# Patient Record
Sex: Female | Born: 1960 | Race: White | Hispanic: No | Smoking: Current every day smoker
Health system: Southern US, Community
[De-identification: ages and names within clinical notes are randomized; demographics above are authoritative.]

## PROBLEM LIST (undated history)

## (undated) DIAGNOSIS — G8921 Chronic pain due to trauma: Secondary | ICD-10-CM

## (undated) DIAGNOSIS — N189 Chronic kidney disease, unspecified: Secondary | ICD-10-CM

## (undated) DIAGNOSIS — M1990 Unspecified osteoarthritis, unspecified site: Secondary | ICD-10-CM

## (undated) DIAGNOSIS — R519 Headache, unspecified: Secondary | ICD-10-CM

## (undated) DIAGNOSIS — I10 Essential (primary) hypertension: Secondary | ICD-10-CM

## (undated) DIAGNOSIS — R51 Headache: Secondary | ICD-10-CM

## (undated) HISTORY — DX: Unspecified osteoarthritis, unspecified site: M19.90

## (undated) HISTORY — DX: Chronic kidney disease, unspecified: N18.9

---

## 1992-01-02 HISTORY — PX: CHOLECYSTECTOMY: SHX55

## 1997-01-01 HISTORY — PX: FRACTURE SURGERY: SHX138

## 2003-10-07 ENCOUNTER — Ambulatory Visit: Payer: Self-pay

## 2004-06-26 ENCOUNTER — Inpatient Hospital Stay: Payer: Self-pay | Admitting: Internal Medicine

## 2004-09-22 ENCOUNTER — Ambulatory Visit: Payer: Self-pay

## 2005-01-21 ENCOUNTER — Emergency Department (HOSPITAL_COMMUNITY): Admission: EM | Admit: 2005-01-21 | Discharge: 2005-01-21 | Payer: Self-pay | Admitting: Emergency Medicine

## 2005-01-22 ENCOUNTER — Ambulatory Visit: Payer: Self-pay | Admitting: Orthopedic Surgery

## 2005-01-30 ENCOUNTER — Ambulatory Visit: Payer: Self-pay | Admitting: Orthopedic Surgery

## 2005-01-30 ENCOUNTER — Ambulatory Visit (HOSPITAL_COMMUNITY): Admission: RE | Admit: 2005-01-30 | Discharge: 2005-01-30 | Payer: Self-pay | Admitting: Orthopedic Surgery

## 2005-02-12 ENCOUNTER — Ambulatory Visit: Payer: Self-pay | Admitting: Orthopedic Surgery

## 2005-02-19 ENCOUNTER — Ambulatory Visit: Payer: Self-pay | Admitting: Orthopedic Surgery

## 2005-03-05 ENCOUNTER — Ambulatory Visit: Payer: Self-pay | Admitting: Orthopedic Surgery

## 2005-03-19 ENCOUNTER — Ambulatory Visit: Payer: Self-pay | Admitting: Orthopedic Surgery

## 2005-04-19 ENCOUNTER — Ambulatory Visit: Payer: Self-pay | Admitting: Orthopedic Surgery

## 2005-06-04 ENCOUNTER — Ambulatory Visit: Payer: Self-pay | Admitting: Orthopedic Surgery

## 2005-06-05 ENCOUNTER — Ambulatory Visit (HOSPITAL_COMMUNITY): Admission: RE | Admit: 2005-06-05 | Discharge: 2005-06-05 | Payer: Self-pay | Admitting: Orthopedic Surgery

## 2005-06-05 ENCOUNTER — Ambulatory Visit: Payer: Self-pay | Admitting: Orthopedic Surgery

## 2005-06-07 ENCOUNTER — Ambulatory Visit: Payer: Self-pay | Admitting: Orthopedic Surgery

## 2005-06-18 ENCOUNTER — Ambulatory Visit: Payer: Self-pay | Admitting: Orthopedic Surgery

## 2005-06-27 ENCOUNTER — Ambulatory Visit: Payer: Self-pay | Admitting: Orthopedic Surgery

## 2005-07-19 ENCOUNTER — Ambulatory Visit: Payer: Self-pay | Admitting: Orthopedic Surgery

## 2005-08-22 ENCOUNTER — Ambulatory Visit: Payer: Self-pay | Admitting: Orthopedic Surgery

## 2005-09-12 ENCOUNTER — Ambulatory Visit: Payer: Self-pay | Admitting: Orthopedic Surgery

## 2005-10-03 ENCOUNTER — Ambulatory Visit: Payer: Self-pay | Admitting: Orthopedic Surgery

## 2005-10-31 ENCOUNTER — Ambulatory Visit: Payer: Self-pay | Admitting: Orthopedic Surgery

## 2006-01-10 ENCOUNTER — Ambulatory Visit: Payer: Self-pay | Admitting: Orthopedic Surgery

## 2006-04-04 ENCOUNTER — Ambulatory Visit: Payer: Self-pay | Admitting: Orthopedic Surgery

## 2006-06-03 ENCOUNTER — Ambulatory Visit: Payer: Self-pay | Admitting: Orthopedic Surgery

## 2006-08-29 ENCOUNTER — Ambulatory Visit: Payer: Self-pay | Admitting: Orthopedic Surgery

## 2006-10-21 IMAGING — RF DG WRIST COMPLETE 3+V*L*
1 series · 5 of 5 positions shown · non-contrast
Comparison: Pre-reduction films of 01/22/05.

CLINICAL DATA: Displaced intraarticular fracture distal radius.  Ulnar styloid fracture.  Open reduction and internal fixation.
LEFT WRIST ? 5 VIEW:

[Series 1139: run · 5 of 5 slices shown]
[im 1/5]
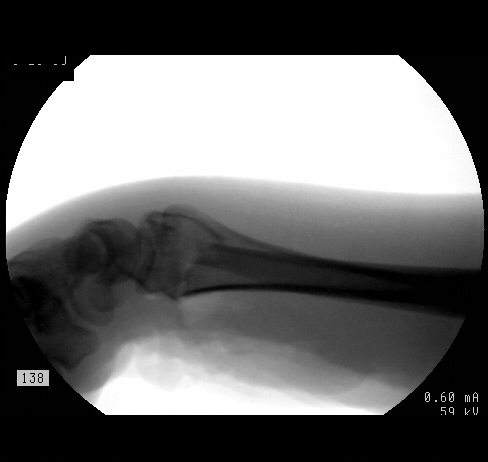
[im 2/5]
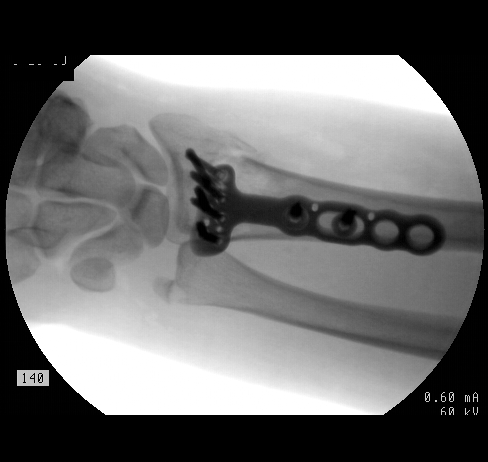
[im 3/5]
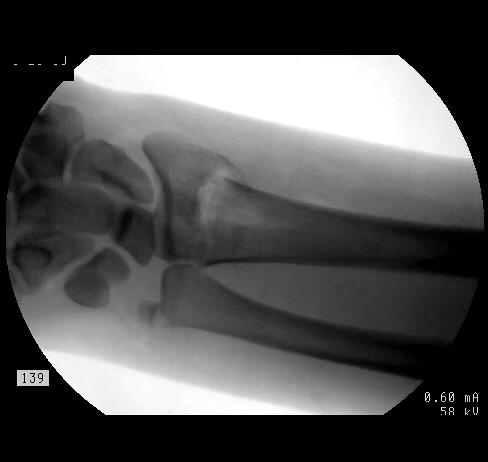
[im 4/5]
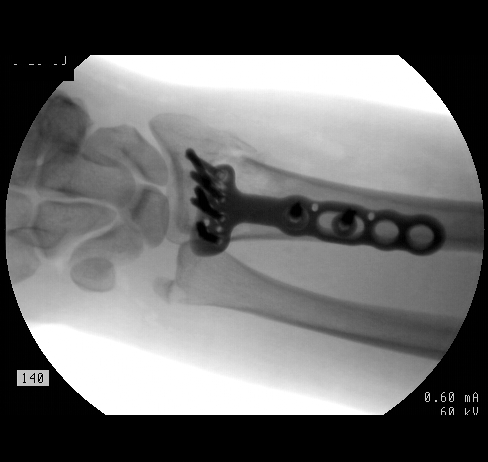
[im 5/5]
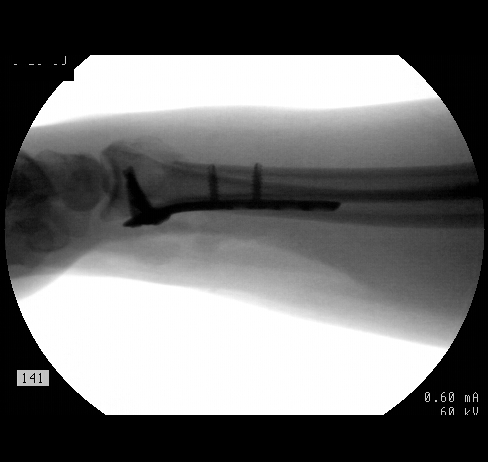

[5 of 5 positions shown; findings below may reference images not displayed]

FINDINGS: Five C-arm guided spot images were made.  Initial AP and lateral projections revealing satisfactory reduction of distal radial metaphyseal fracture and ulnar styloid process fracture.  Three views, two frontal and one lateral, revealing contoured plate and screw fixation of the distal radial metadiaphyseal region.  The plate lies along the dorsal aspect of the radius.  Fracture fragments have been reduced in satisfactory position and alignment.
IMPRESSION: Satisfactory ORIF right wrist.

## 2006-10-29 ENCOUNTER — Encounter: Payer: Self-pay | Admitting: Orthopedic Surgery

## 2006-11-25 ENCOUNTER — Encounter: Payer: Self-pay | Admitting: Orthopedic Surgery

## 2006-12-03 ENCOUNTER — Encounter: Payer: Self-pay | Admitting: Orthopedic Surgery

## 2006-12-13 ENCOUNTER — Encounter: Payer: Self-pay | Admitting: Orthopedic Surgery

## 2007-01-29 ENCOUNTER — Telehealth: Payer: Self-pay | Admitting: Orthopedic Surgery

## 2007-02-10 ENCOUNTER — Ambulatory Visit: Payer: Self-pay | Admitting: Orthopedic Surgery

## 2007-02-10 DIAGNOSIS — M25549 Pain in joints of unspecified hand: Secondary | ICD-10-CM

## 2007-05-12 ENCOUNTER — Ambulatory Visit: Payer: Self-pay | Admitting: Orthopedic Surgery

## 2007-09-03 ENCOUNTER — Ambulatory Visit: Payer: Self-pay | Admitting: Orthopedic Surgery

## 2007-11-24 ENCOUNTER — Encounter: Payer: Self-pay | Admitting: Orthopedic Surgery

## 2007-12-22 ENCOUNTER — Ambulatory Visit: Payer: Self-pay | Admitting: Orthopedic Surgery

## 2007-12-22 DIAGNOSIS — M19049 Primary osteoarthritis, unspecified hand: Secondary | ICD-10-CM | POA: Insufficient documentation

## 2008-02-23 ENCOUNTER — Ambulatory Visit: Payer: Self-pay | Admitting: Orthopedic Surgery

## 2008-03-22 ENCOUNTER — Encounter: Payer: Self-pay | Admitting: Orthopedic Surgery

## 2008-03-22 ENCOUNTER — Telehealth: Payer: Self-pay | Admitting: Orthopedic Surgery

## 2008-05-14 ENCOUNTER — Encounter (INDEPENDENT_AMBULATORY_CARE_PROVIDER_SITE_OTHER): Payer: Self-pay | Admitting: *Deleted

## 2008-05-20 ENCOUNTER — Ambulatory Visit: Payer: Self-pay | Admitting: Orthopedic Surgery

## 2008-05-21 ENCOUNTER — Encounter (INDEPENDENT_AMBULATORY_CARE_PROVIDER_SITE_OTHER): Payer: Self-pay | Admitting: *Deleted

## 2008-06-29 ENCOUNTER — Telehealth: Payer: Self-pay | Admitting: Orthopedic Surgery

## 2008-09-01 ENCOUNTER — Ambulatory Visit: Payer: Self-pay | Admitting: Orthopedic Surgery

## 2010-05-19 NOTE — H&P (Signed)
NAME:  Jessica Haley, Jessica Haley                 ACCOUNT NO.:  [id number]   MEDICAL RECORD NO.:  [id number]          PATIENT TYPE:  AMB   LOCATION:  DAY                           FACILITY:  [facility]   PHYSICIAN:  Vickki Hearing, M.D.DATE OF BIRTH:  [birth year]   DATE OF ADMISSION:  DATE OF DISCHARGE:  LH                                HISTORY & PHYSICAL   CHIEF COMPLAINT:  Numbness and tingling of the ring, long finger and thumb  of the right hand status post open treatment internal fixation distal radius  fracture with volar plating on 01/29/2005.  The patient is doing well terms  of motion in the wrist, strength is improving but she still has nocturnal  pain and tingling and numbness in the fingers as described.  She also has  hypersensitivity of distal portion of the scar and may have a median nerve  entrapment so we have recommended median nerve release   Review of systems report per the patient is negative.   ALLERGIES:  None.   MEDICAL PROBLEMS:  None.   PAST SURGERIES:  None.   MEDICATIONS:  Effexor 150 extended release once a day, Lorcet 10-06-48 1  q.4h. p.r.n. for pain #60.   FAMILY PHYSICIAN:  Dr. Graciela Husbands in [place].   She is single, does not smoke, no major drinking, caffeine use 2 cups per  day.   EXAM:  Weight is approximately 170, pulse 86, respiratory rate 18.  General  appearance normal.  Cardiovascular exam:  Normal pulse and perfusion.  Abdomen soft, no distention.  Neurologic exam: she is alert, awake and  oriented x3.  Mood was pleasant.  Sensation showed hypersensitivity distal  scar, decreased sensation of the long finger, ring finger and thumb.  Reflexes remain normal.  Lymphatic system is benign.  Examination of the  wrist shows a volar scar with tenderness over the distal portion of the scar  with hypersensitivity, decreased sensation in the fingers described.   Motion in the wrist now 45 degrees total with extension and flexion  combined.   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  Risks and benefits explained.  The patient understands she will  need some postoperative home exercises to work on strength and mobility.      Vickki Hearing, M.D.  Electronically Signed     SEH/MEDQ  D:  06/04/2005  T:  06/05/2005  Job:  [id number]

## 2010-05-19 NOTE — H&P (Signed)
NAME:  SHUNTEL, FISHBURN                 ACCOUNT NO.:  [id number]   MEDICAL RECORD NO.:  [id number]          PATIENT TYPE:  AMB   LOCATION:  DAY                           FACILITY:  [facility]   PHYSICIAN:  Vickki Hearing, M.D.DATE OF BIRTH:  1960/05/26   DATE OF ADMISSION:  01/29/2005  DATE OF DISCHARGE:  LH                                HISTORY & PHYSICAL   SHORT-STAY ADMISSION:   CHIEF COMPLAINT:  Pain, right wrist.   HISTORY:  This is a 50 year old female who fell January 21, 2005, sustained  distal radius fracture.  Had a closed reduction in the office to include  position.  She will have an open treatment and internal fixation with a  volar wrist plate.  She has consented for surgery.  Understands the risks  and benefits and option of no surgery with continued pain and deformity and  progressive and permanent loss of motion in the wrist without any procedure  done.   REVIEW OF SYSTEMS:  Reported by the patient as negative.   ALLERGIES:  She has no allergies.   She has no medical problems, no previous surgery.   Her only medication is Effexor 150 mg extended release once a day.   Her family physician is Dr. Alberteen Spindle in [place].  She is single, does not  smoke.  No major drinking, only on occasion socially.  Caffeine use: 2 cups  a day.   PHYSICAL EXAMINATION:  VITAL SIGNS:  Weight 170.  Pulse 86, respiratory rate  18.  GENERAL APPEARANCE:  Normal.  CARDIOVASCULAR: Normal.  ABDOMEN:  Soft, no distention.  NEUROLOGIC:  She was alert.  She was awake. She was oriented x3.  Her mood  was pleasant.  Sensation was normal.  Reflexes were intact.  LYMPHATIC SYSTEM: Normal.  EXTREMITIES: She had deformity of the right wrist, tenderness over the  distal radius.  She could move her fingers.  Her range of motion was limited  secondary to pain.   Radiographs show a dorsally displaced apex volarly angulated fracture of the  distal radius.   PLAN:  Open treatment and internal fixation  with a volar wrist plate to the  right wrist.      Vickki Hearing, M.D.  Electronically Signed     SEH/MEDQ  D:  01/29/2005  T:  01/29/2005  Job:  [id number]   cc:   [facility]  Fax: [phone]

## 2010-05-19 NOTE — Op Note (Signed)
NAME:  CHERRE, KOTHARI                 ACCOUNT NO.:  [id number]   MEDICAL RECORD NO.:  [id number]          PATIENT TYPE:  AMB   LOCATION:  DAY                           FACILITY:  [facility]   PHYSICIAN:  Vickki Hearing, M.D.DATE OF BIRTH:  [birth year]   DATE OF PROCEDURE:  06/05/2005  DATE OF DISCHARGE:                                 OPERATIVE REPORT   HISTORY:  Jessica Haley is a 50 year old female. She underwent open treatment  and internal fixation of a right distal radius fracture on January 29, 2005,  with volar plating. She did well but complained of nocturnal pain, numbness  and tingling of the long and ring finger and thumb. She was felt to have  carpal tunnel syndrome, post-traumatic, due scarring.   PREOPERATIVE DIAGNOSIS:  Right carpal tunnel syndrome.   POSTOPERATIVE DIAGNOSIS:  Right carpal tunnel syndrome.   PROCEDURE:  Open carpal tunnel release, lysis of adhesions, median nerve.   SURGEON:  Vickki Hearing, M.D.   ASSISTANT:  Forde Dandy.   ANESTHETIC:  Regional block.   SPECIMENS:  None.   OPERATIVE FINDINGS:  The distal extent of the carpal tunnel was essentially  normal.  The contents of the carpal tunnel were normal.  However, from the  flexion crease approximately 1/3 proximally into the forearm with the  previous incision was made, there was thickened, hypertrophic scar tissue  which communicated with the median nerve with adhesions.  These were  released.  The nerve was intact.   DESCRIPTION OF PROCEDURE:  The patient was seen in the preop holding area,  identified as Jessica Haley. Her right wrist was marked for surgery,  countersigned by the surgeon and the history and physical was updated.  She  was given a gram of Ancef.  She was taken to the operating room for regional  block.  After successful Bier block, the patient's right upper extremity was  prepped and draped using sterile technique from the fingers to the proximal  arm.  We took a time-out  completed as required.  Using the previous incision  and extending it into the palm in the mid palmar crease, the sub  subcutaneous tissue was then divided.  We started distal to proximal from  normal tissue to scar tissue, found the distal aspect of the transverse  carpal ligament, passed blunt instrumentation beneath the ligament to free  the nerve, divided the ligament up to the flexor retinaculum.  We teased the  scar tissue away from the skin area and the subcutaneous tissue.  We  immediately encountered hypertrophic scarring which was sharply dissected  from the underlying tissue.  We continued this dissection proximally and  found the scar tissue to be in continuity with the nerve. We released this  and then irrigated the wound.  We closed in layered fashion with 2-0  Monocryl and 3-0 nylon.  We did inject some local anesthetic, 10 mL, to  assist with postoperative anesthesia.  We placed the patient in a bulky  dressing.  She was taken to the recovery room in stable condition.   Follow-up will  be on Thursday for dressing change.  She is to keep the  dressing dry and clean. She is allowed to move the fingers.  She should keep  it elevated, apply ice packs as needed to control swelling.      Vickki Hearing, M.D.  Electronically Signed     SEH/MEDQ  D:  06/05/2005  T:  06/05/2005  Job:  [id number]

## 2010-05-19 NOTE — Op Note (Signed)
NAME:  Jessica Haley, Jessica Haley                 ACCOUNT NO.:  [id number]   MEDICAL RECORD NO.:  [id number]          PATIENT TYPE:  AMB   LOCATION:  DAY                           FACILITY:  [facility]   PHYSICIAN:  Vickki Hearing, M.D.DATE OF BIRTH:  Mar 01, 1960   DATE OF PROCEDURE:  01/30/2005  DATE OF DISCHARGE:                                 OPERATIVE REPORT   PREOPERATIVE DIAGNOSIS:  Fracture, right distal radius.   POSTOPERATIVE DIAGNOSIS:  Fracture, right distal radius.   PROCEDURE:  Open treatment internal fixation.   IMPLANTS USED:  DVR volar plate.   SURGEON:  Dr. Romeo Apple.   OPERATIVE FINDINGS:  Displaced distal radius fracture of the right upper  extremity.   INDICATIONS:  100% displacement and unstable fracture.   ANESTHETIC:  General.   The patient was identified in the preop holding area. Her right wrist was  marked surgery, countersigned by the surgeon. She was given antibiotics.  History and physical were updated. She was taken to the operating for  general anesthesia. Her right upper extremity was prepped and draped with  sterile technique. Time-out was taken as required. Closed reduction was  performed. I made the decision to go with a DVR plate versus an external  fixator based on the intraoperative tray.   After elevating the tourniquet, an incision was made over the flexor carpi  radialis tendon. Subcutaneous tissue was divided. Radial artery was  protected tendon. Sheath was incised. Tendon was moved ulnarly. These floor  of the FCR tendon was incised. The pronator quadratus was incised and moved  ulnarly as well. Fracture site was exposed. A ____________ was used to  manipulate the fracture into better position. Volar plate was applied with  one screw in the cortical bone, and the distal part of the plate was  adjusted to fit the distal fragment and also maintain fixation without going  into the joint. We then used a series of screws, locking and fully-threaded  and pegs to get fixation in the distal fragment. We placed one screw  ulnarly, then checked a radiograph, and I was happy with position. We then  filled all of the holes and then placed a second screw in the cortical bone  as well. We irrigated the wound, took final films including a 30 degree  lateral x-ray along with AP views. The fracture reduction was very good.  Wound was closed with 2-0 Monocryl and staples. We injected 1/2%  Sensorcaine. We put on the volar splint. We extubated  the patient and took her to the recovery room in stable condition. She is  discharged with 90 Tylox. She will follow up on Thursday for dressing  change, and at that time, we will also recommend that she get a volar  fabricated splint from occupational therapy and start flexion of the wrist  two weeks postoperatively.      Vickki Hearing, M.D.  Electronically Signed     SEH/MEDQ  D:  01/30/2005  T:  01/30/2005  Job:  [id number]

## 2012-02-29 ENCOUNTER — Ambulatory Visit: Payer: Self-pay | Admitting: Internal Medicine

## 2012-03-08 ENCOUNTER — Ambulatory Visit: Payer: Self-pay | Admitting: Gastroenterology

## 2012-03-10 ENCOUNTER — Ambulatory Visit: Payer: Self-pay | Admitting: Gastroenterology

## 2012-03-12 LAB — PATHOLOGY REPORT

## 2012-11-03 ENCOUNTER — Ambulatory Visit: Payer: Self-pay | Admitting: Pain Medicine

## 2015-01-10 ENCOUNTER — Ambulatory Visit: Payer: Self-pay | Admitting: Urology

## 2015-01-28 ENCOUNTER — Other Ambulatory Visit: Payer: Self-pay | Admitting: Internal Medicine

## 2015-01-28 DIAGNOSIS — R102 Pelvic and perineal pain: Secondary | ICD-10-CM

## 2015-01-28 DIAGNOSIS — R109 Unspecified abdominal pain: Principal | ICD-10-CM

## 2015-02-03 ENCOUNTER — Ambulatory Visit: Payer: Self-pay

## 2015-02-03 ENCOUNTER — Ambulatory Visit
Admission: RE | Admit: 2015-02-03 | Discharge: 2015-02-03 | Disposition: A | Discharge status: Home / Self Care | Payer: Managed Care, Other (non HMO) | Source: Ambulatory Visit | Attending: Internal Medicine | Admitting: Internal Medicine

## 2015-02-03 DIAGNOSIS — R102 Pelvic and perineal pain: Secondary | ICD-10-CM

## 2015-02-03 DIAGNOSIS — R1084 Generalized abdominal pain: Secondary | ICD-10-CM | POA: Diagnosis present

## 2015-02-03 DIAGNOSIS — R109 Unspecified abdominal pain: Secondary | ICD-10-CM

## 2015-02-03 MED ORDER — IOHEXOL 300 MG/ML  SOLN
100.0000 mL | Freq: Once | INTRAMUSCULAR | Status: AC | PRN
  Administered 2015-02-03: 100 mL via INTRAVENOUS

## 2015-03-24 ENCOUNTER — Encounter: Payer: Self-pay | Admitting: *Deleted

## 2015-04-07 ENCOUNTER — Ambulatory Visit: Payer: Self-pay | Admitting: General Surgery

## 2015-04-12 ENCOUNTER — Other Ambulatory Visit: Payer: Self-pay

## 2015-04-12 ENCOUNTER — Encounter: Payer: Self-pay | Admitting: General Surgery

## 2015-04-12 ENCOUNTER — Ambulatory Visit (INDEPENDENT_AMBULATORY_CARE_PROVIDER_SITE_OTHER): Payer: Managed Care, Other (non HMO) | Admitting: General Surgery

## 2015-04-12 DIAGNOSIS — E041 Nontoxic single thyroid nodule: Secondary | ICD-10-CM

## 2015-04-12 NOTE — Patient Instructions (Addendum)
Call office for any issues or concerns. Will follow up after scan.   Patient has been scheduled for a parathyroid scan at [facility] for Wednesday, 04-20-15 at 11 am (arrive 10:30 am). Prep: NPO after midnight.

## 2015-04-12 NOTE — Progress Notes (Signed)
Patient ID: Jessica Haley, female   DOB: 03/21/1960, 55 y.o.   MRN: [id number]  Chief Complaint  Patient presents with  . Other    hyperparathyroidism     HPI Jessica Haley is a 55 y.o. female here today for thyroid issues. She state she has been having issues for a year or two. She was told she high calcium in her blood. She has been really tired, forgetful. She has been seeing a pain management clinic in [place] for chronic pain associated with a prior right forearm wrist injury.  She does not get regular mammograms, she states her last one was a few years ago.  I have reviewed the history of present illness with the patient.  HPI  Past Medical History  Diagnosis Date  . Arthritis   . Chronic kidney disease     Stones    Past Surgical History  Procedure Laterality Date  . Cholecystectomy  1994  . Fracture surgery  1999    screws in arm    History reviewed. No pertinent family history.  Social History Social History  Substance Use Topics  . Smoking status: Current Every Day Smoker -- 5 years    Types: Cigarettes  . Smokeless tobacco: None  . Alcohol Use: 0.6 - 1.2 oz/week    1-2 Standard drinks or equivalent per week    No Active Allergies  Current Outpatient Prescriptions  Medication Sig Dispense Refill  . meloxicam (MOBIC) 15 MG tablet Take 15 mg by mouth daily.    Marland Kitchen oxyCODONE (OXYCONTIN) 20 mg 12 hr tablet     . venlafaxine XR (EFFEXOR-XR) 75 MG 24 hr capsule     . Oxycodone HCl 10 MG TABS Take by mouth.     No current facility-administered medications for this visit.    Review of Systems Review of Systems  Constitutional: Positive for fatigue.  Respiratory: Negative.   Cardiovascular: Negative.     Blood pressure 138/72, pulse 88, resp. rate 12, height 5\' 7"  (1.702 m), weight 213 lb (96.616 kg).  Physical Exam Physical Exam  Constitutional: She is oriented to person, place, and time. She appears well-developed and well-nourished.  Eyes: Conjunctivae  are normal. No scleral icterus.  Neck: Neck supple. No thyromegaly present.  Cardiovascular: Normal rate, regular rhythm and normal heart sounds.   Pulmonary/Chest: Effort normal and breath sounds normal.  Abdominal: Soft. She exhibits no mass. There is no hepatomegaly. There is no tenderness.  Lymphadenopathy:    She has no cervical adenopathy.  Neurological: She is alert and oriented to person, place, and time.  Skin: Skin is warm and dry.    Data Reviewed Notes and Labs reviewed.  Neck US as performed. There is a dominant left thyroid nodule with mixed echogenicity, a cyst in right lobe. There ae other 2-50mm nodules on both sides. No obvious suggestion of a parathyroid mass Assessment    Hypercalcemia. Elevated PTH 2 mos ago. Ca levels around 11.    Thyroid nodule.   Plan   Discussed findings fully with pt.  Arrange to repeat CA, PTH, T4,TSH. Parathyroid scan.      Patient has been scheduled for a parathyroid scan at [facility] for Wednesday, 04-20-15 at 11 am (arrive 10:30 am). Prep: NPO after midnight.   PCP: Dr. Caryl Comes This information has been scribed by Verlene Mayer, CMA      [facility] 04/13/2015, 11:17 AM

## 2015-04-13 ENCOUNTER — Encounter: Payer: Self-pay | Admitting: General Surgery

## 2015-04-20 ENCOUNTER — Encounter
Admission: RE | Admit: 2015-04-20 | Discharge: 2015-04-20 | Disposition: A | Discharge status: Home / Self Care | Payer: Managed Care, Other (non HMO) | Source: Ambulatory Visit | Attending: General Surgery | Admitting: General Surgery

## 2015-04-20 DIAGNOSIS — E041 Nontoxic single thyroid nodule: Secondary | ICD-10-CM | POA: Insufficient documentation

## 2015-04-20 MED ORDER — TECHNETIUM TC 99M SESTAMIBI - CARDIOLITE
24.8500 | Freq: Once | INTRAVENOUS | Status: AC | PRN
  Administered 2015-04-20: 12:00:00 24.85 via INTRAVENOUS

## 2015-04-25 ENCOUNTER — Telehealth: Payer: Self-pay | Admitting: *Deleted

## 2015-04-25 NOTE — Telephone Encounter (Signed)
Second message left for patient to have labs T4, TSH & PTH drawn before appointment 04/26/15.

## 2015-04-26 ENCOUNTER — Encounter: Payer: Self-pay | Admitting: General Surgery

## 2015-04-26 ENCOUNTER — Ambulatory Visit (INDEPENDENT_AMBULATORY_CARE_PROVIDER_SITE_OTHER): Payer: Managed Care, Other (non HMO) | Admitting: General Surgery

## 2015-04-26 DIAGNOSIS — E041 Nontoxic single thyroid nodule: Secondary | ICD-10-CM | POA: Diagnosis not present

## 2015-04-26 NOTE — Patient Instructions (Signed)
Patient is scheduled for surgery at [facility] on 05/09/15. She will pre admit by phone. Patient is aware of date and instructions.

## 2015-04-26 NOTE — Progress Notes (Signed)
This is a 55 year old female here today for her follow up parathyroid scan done on 04/20/15. I have reviewed the history of present illness with the patient.   Scan was reviewed showing persistent uptake in the right posterior thyroid region\ With suspicion of a 51mm nodule behind the right lower lobe of the thyroid, consitent with parathyroid adenoma.  No apparent thyroid nodules were noted on this study. Patient had repeat PTH and Ca levels drawn today.  Discussed excision of parathyroid adenoma and patient is agreeable Procedure risks and benefits were explained   Patient is scheduled for surgery at [facility] on 05/09/15. She will pre admit by phone. Patient is aware of date and instructions.   PCP:  [facility]  This information has been scribed by Gaspar Cola CMA.

## 2015-04-27 ENCOUNTER — Ambulatory Visit: Payer: Self-pay | Admitting: Urology

## 2015-04-27 LAB — PTH, INTACT AND CALCIUM
CALCIUM: 10.9 mg/dL — AB (ref 8.7–10.2)
PTH: 79 pg/mL — AB (ref 15–65)

## 2015-04-27 LAB — T4 AND TSH
T4, Total: 7.4 ug/dL (ref 4.5–12.0)
TSH: 2.18 u[IU]/mL (ref 0.450–4.500)

## 2015-04-29 MED ORDER — CEFAZOLIN SODIUM-DEXTROSE 2-4 GM/100ML-% IV SOLN
INTRAVENOUS | Status: AC
  Filled 2015-04-29: qty 100

## 2015-04-29 MED ORDER — FAMOTIDINE 20 MG PO TABS
ORAL_TABLET | ORAL | Status: AC
  Filled 2015-04-29: qty 1

## 2015-05-02 ENCOUNTER — Other Ambulatory Visit: Payer: Managed Care, Other (non HMO)

## 2015-05-05 ENCOUNTER — Encounter: Payer: Self-pay | Admitting: *Deleted

## 2015-05-05 NOTE — Pre-Procedure Instructions (Signed)
Cheryll Cockayne, MD - 03/23/2015 4:00 PM EDT Formatting of this note may be different from the original. Jessica Haley is evaluated for hypertension, hyperglycemia, hyperlipidemia, chronic arm pain, depression, hyperparathyroidism. She continues to work with pain management for her chronic arm pain; this is reasonably controlled. She did not yet do her 24-hour urine collection for calcium, but her last serum calcium was improved. Continues to have a lot of fatigue; she wonders whether the calcium plays a role, but it did not really seem to change when her calcium level improved. She is not sleeping well secondary to frequent nocturia. She is better when she is up and moving. She notices that it does get worse when she is lying on the couch. Blood recent urinalysis and culture were negative. Has not been back over to Dr. Laurey Morale. Depression/anxiety appears to be better. Blood pressure was better on recheck and remains controlled when she checks it elsewhere.  ROS Please see HPI; remainder of complete 10 point ROS is negative  Current Outpatient Prescriptions  Medication Sig Dispense Refill  . MELOXICAM (MOBIC ORAL) Take by mouth. One po in am  . oxyCODONE (ROXICODONE) 10 mg tablet Take 10 mg by mouth 3 (three) times daily as needed for Pain.  . OXYCONTIN 20 mg CR tablet TK 1 T PO BID 0  . venlafaxine (EFFEXOR-XR) 75 MG XR capsule Take 3 capsules (225 mg total) by mouth once daily. 90 capsule 11   No current facility-administered medications for this visit.   Allergies as of 03/23/2015 Elta Gu[place] as Reviewed 03/23/2015  Allergen Reaction Noted  . Codeine sulfate Nausea 09/14/2013  . Lisinopril Other (See Comments) 09/14/2013   Past Medical History  Diagnosis Date  . Depression, unspecified  . Hyperglycemia  . Hyperlipidemia, unspecified  . Hyperparathyroidism (CMS-HCC) 01/23/2015  . Hypertension  . Migraine headache   Past Surgical History  Procedure Laterality Date  . Cholecystectomy   . Status post fall with right wrist fracture january 2007 requiring surgical plating, dr. Arther Abbott  . Probable carpal tunnel surgery, per patient description, 2007   Results for orders placed or performed in visit on 02/02/15  EXTERNAL RADIOLOGY RESULT -CT  Narrative  Ordered by an unspecified provider.   Visit Vitals  . BP 138/86  . Pulse (P) 81  . Ht (P) 167.6 cm ('5\' 6"' )  . Wt (P) 96.2 kg (212 lb)  . SpO2 (P) 96%  . BMI (P) 34.22 kg/m2   GENERAL: Well developed, well nourished female, in no acute distress. HEENT: Pupils equal, round and reactive to light. Oropharynx is moist without lesions. Extraocular motion intact.  NECK: Supple, trachea midline. No thyromegaly.  CHEST: Chest wall is within normal limits.  LUNGS: Clear bilaterally without retractions or wheezing.  CARDIAC: Regular rate and rhythm without murmurs, rubs or gallops.  VASCULAR: Carotid and radial pulses are 2+. Distal pulses 2+.  ABDOMEN: Soft, nontender, positive bowel sounds. No guard, rebound, mass.  EXTREMITIES: No clubbing, cyanosis or edema. Postoperative changes in the right wrist; muscular atrophy.  NEUROLOGIC: Cranial nerves intact with gait normal. Decreased grip in the right hand.  DERMATOLOGIC: Multiple tattoos; no significant rashes or nodules. LYMPH: No cervical or supraclavicular lymphadenopathy   Essential hypertension (primary encounter diagnosis) Post-traumatic osteoarthritis of right hand Other hyperlipidemia Hyperglycemia Recurrent major depressive disorder, in full remission (CMS-HCC) Hyperparathyroidism (CMS-HCC) Nocturnal polyuria  Assessment and Plan  1. Hypertension. Better on recheck; she needs to follow this. Limit salt. Follow mets C, lipids, TSH,  urinalysis.  2. Tobacco abuse. Have encouraged complete avoidance of tobacco; she is not interested currently. She will let us know if we can assist 3. Hyperglycemia. Continue to work on weight reduction. Monitor met B, A1c,  urinalysis periodically. Treatment guidelines have been reviewed  4. Chronic arm pain. Continue to follow up with the pain clinic. Pain medications are through that office 5. Hyperlipidemia. Again encouraged lifestyle efforts. Follow lipids periodically. 6. Depression/anxiety. Better on current regimen. Contact us for progression 7. Hypercalcemia/hyperparathyroidism. Discussed findings. CT of the abdomen/pelvis did not show any stones. 24-hour urine collection for calcium was again ordered. Recheck metabolic panel today 8. Dysuria/polyuria. CT unremarkable. Recent urine culture negative. Urology consultation, as I am concerned her insomnia related to this is a significant portion of her fatigue 9. health Maintenance. She has declined colonoscopy and Hemoccults previously. She declines all immunizations and mammogram and is aware of risk. Follow-up in 3 months, sooner if needed  [place], MD  Portions of this note were created using dictation software and may contain typographical errors.   Plan of Treatment - as of this encounter Upcoming Encounters Upcoming Encounters  Date Type Specialty Care Team Description  06/16/2015 Ancillary Orders Lab Cheryll Cockayne, MD  [place]  [facility] [place], [place] [address]  [phone]  [phone] ([phone]    06/23/2015 Office Visit Internal Medicine Cheryll Cockayne, MD  [place]  [facility] [place], [place] [address]  [phone]  [phone] (Fax)     Scheduled Tests Scheduled Tests  Name Priority Associated Diagnoses Order Schedule  Calcium, 24Hr Urine - [facility] Routine Hyperparathyroidism (CMS-HCC)  Ordered: 03/23/2015  CBC w/auto Differential (5 Part) Routine Post-traumatic osteoarthritis of right hand  Expected: 06/01/2015 (Approximate), Expires: 03/23/2016  Comprehensive Metabolic Panel (CMP) Routine Essential hypertension  Expected: 06/14/2015 (Approximate),  Expires: 03/23/2016  Hemoglobin A1C Routine Hyperglycemia  Expected: 06/01/2015 (Approximate), Expires: 03/23/2016  Lipid Panel w/calc LDL Routine Other hyperlipidemia  Expected: 06/14/2015 (Approximate), Expires: 03/23/2016  Urinalysis w/Microscopic Routine Hyperglycemia  Expected: 06/01/2015 (Approximate), Expires: 03/23/2016   Scheduled Referrals Scheduled Referrals  Name Priority Associated Diagnoses Order Schedule  Ambulatory Referral to Urology Routine Nocturnal polyuria  Ordered: 03/23/2015  Lab Results - in this encounter   Basic Metabolic Panel (BMP) (63/14/9702 4:35 PM) Basic Metabolic Panel (BMP) (63/78/5885 4:35 PM)  Component Value Ref Range  Glucose 79 70 - 110 mg/dL  Sodium 141 136 - 145 mmol/L  Potassium 4.3 3.6 - 5.1 mmol/L  Chloride 108 97 - 109 mmol/L  Carbon Dioxide (CO2) 29.0 22.0 - 32.0 mmol/L  Calcium 11.3 (H) 8.7 - 10.3 mg/dL  Urea Nitrogen (BUN) 21 7 - 25 mg/dL  Creatinine 0.8 0.6 - 1.1 mg/dL  Glomerular Filtration Rate (eGFR), MDRD Estimate 74 >60 mL/min/1.73sq m  BUN/Crea Ratio 26.3 (H) 6.0 - 20.0  Anion Gap w/K 8.3 6.0 - 02.7   Basic Metabolic Panel (BMP) (74/12/8784 4:35 PM)  Specimen Performing Laboratory  [facility]  [place], [place] [address]   Visit Diagnoses - in this encounter Diagnosis  Essential hypertension - Primary  Post-traumatic osteoarthritis of right hand  Other hyperlipidemia  Hyperglycemia, unspecified  Other abnormal glucose   Recurrent major depressive disorder, in full remission (CMS-HCC)  Hyperparathyroidism (CMS-HCC)  Nocturnal polyuria  Document Information Service Providers Document Coverage Dates Mar. 22, 2017 - Mar. 22, 2017 [place] [phone] (Work) [place],  [address]  Encounter Providers Cheryll Cockayne MD (Attending) [phone] (Work) [phone] (Fax)  [facility]  [place], [place] [address]

## 2015-05-05 NOTE — Patient Instructions (Signed)
  Your procedure is scheduled on: 05-09-15 Report to [facility] To find out your arrival time please call [phone] between 1PM - 3PM on 05-06-15  Remember: Instructions that are not followed completely may result in serious medical risk, up to and including death, or upon the discretion of your surgeon and anesthesiologist your surgery may need to be rescheduled.    _X___ 1. Do not eat food or drink liquids after midnight. No gum chewing or hard candies.     _X___ 2. No Alcohol for 24 hours before or after surgery.   ____ 3. Bring all medications with you on the day of surgery if instructed.    ____ 4. Notify your doctor if there is any change in your medical condition     (cold, fever, infections).     Do not wear jewelry, make-up, hairpins, clips or nail polish.  Do not wear lotions, powders, or perfumes. You may wear deodorant.  Do not shave 48 hours prior to surgery. Men may shave face and neck.  Do not bring valuables to the hospital.    [facility] is not responsible for any belongings or valuables.               Contacts, dentures or bridgework may not be worn into surgery.  Leave your suitcase in the car. After surgery it may be brought to your room.  For patients admitted to the hospital, discharge time is determined by your treatment team.   Patients discharged the day of surgery will not be allowed to drive home.   Please read over the following fact sheets that you were given:      __X__ Take these medicines the morning of surgery with A SIP OF WATER:    1. OXYCONTIN  2. EFFEXOR  3.   4.  5.  6.  ____ Fleet Enema (as directed)   ____ Use CHG Soap as directed  ____ Use inhalers on the day of surgery  ____ Stop metformin 2 days prior to surgery    ____ Take 1/2 of usual insulin dose the night before surgery and none on the morning of surgery.   ____ Stop Coumadin/Plavix/aspirin-N/A  _X___ Stop Anti-inflammatories-STOP  MELOXICAM NOW-NO NSAIDS OR ASA PRODUCTS-OXYCODONE/TYLENOL OK TO TAKE   ____ Stop supplements until after surgery.    ____ Bring C-Pap to the hospital.

## 2015-05-06 ENCOUNTER — Encounter: Payer: Self-pay | Admitting: *Deleted

## 2015-05-09 ENCOUNTER — Ambulatory Visit
Admission: RE | Admit: 2015-05-09 | Discharge: 2015-05-09 | Disposition: A | Discharge status: Home / Self Care | Payer: Managed Care, Other (non HMO) | Source: Ambulatory Visit | Attending: General Surgery | Admitting: General Surgery

## 2015-05-09 ENCOUNTER — Encounter: Payer: Self-pay | Admitting: *Deleted

## 2015-05-09 ENCOUNTER — Ambulatory Visit: Payer: Managed Care, Other (non HMO) | Admitting: Anesthesiology

## 2015-05-09 ENCOUNTER — Encounter
Admission: RE | Disposition: A | Discharge status: Home / Self Care | Payer: Self-pay | Source: Ambulatory Visit | Attending: General Surgery

## 2015-05-09 DIAGNOSIS — M1990 Unspecified osteoarthritis, unspecified site: Secondary | ICD-10-CM | POA: Insufficient documentation

## 2015-05-09 DIAGNOSIS — D351 Benign neoplasm of parathyroid gland: Secondary | ICD-10-CM | POA: Insufficient documentation

## 2015-05-09 DIAGNOSIS — Z87442 Personal history of urinary calculi: Secondary | ICD-10-CM | POA: Insufficient documentation

## 2015-05-09 DIAGNOSIS — I10 Essential (primary) hypertension: Secondary | ICD-10-CM | POA: Insufficient documentation

## 2015-05-09 DIAGNOSIS — E213 Hyperparathyroidism, unspecified: Secondary | ICD-10-CM | POA: Diagnosis not present

## 2015-05-09 DIAGNOSIS — R51 Headache: Secondary | ICD-10-CM | POA: Diagnosis not present

## 2015-05-09 DIAGNOSIS — E669 Obesity, unspecified: Secondary | ICD-10-CM | POA: Diagnosis not present

## 2015-05-09 DIAGNOSIS — Z6832 Body mass index (BMI) 32.0-32.9, adult: Secondary | ICD-10-CM | POA: Diagnosis not present

## 2015-05-09 DIAGNOSIS — F17200 Nicotine dependence, unspecified, uncomplicated: Secondary | ICD-10-CM | POA: Diagnosis not present

## 2015-05-09 HISTORY — PX: PARATHYROIDECTOMY: SHX19

## 2015-05-09 HISTORY — DX: Headache: R51

## 2015-05-09 HISTORY — DX: Essential (primary) hypertension: I10

## 2015-05-09 HISTORY — DX: Chronic pain due to trauma: G89.21

## 2015-05-09 HISTORY — DX: Headache, unspecified: R51.9

## 2015-05-09 LAB — CALCIUM: CALCIUM: 10.3 mg/dL (ref 8.9–10.3)

## 2015-05-09 SURGERY — PARATHYROIDECTOMY
Anesthesia: General | Wound class: Clean

## 2015-05-09 MED ORDER — BUPIVACAINE HCL (PF) 0.5 % IJ SOLN
INTRAMUSCULAR | Status: DC | PRN
  Administered 2015-05-09: 13 mL

## 2015-05-09 MED ORDER — DEXAMETHASONE SODIUM PHOSPHATE 10 MG/ML IJ SOLN
INTRAMUSCULAR | Status: DC | PRN
  Administered 2015-05-09: 5 mg via INTRAVENOUS

## 2015-05-09 MED ORDER — ROCURONIUM BROMIDE 100 MG/10ML IV SOLN
INTRAVENOUS | Status: DC | PRN
  Administered 2015-05-09: 40 mg via INTRAVENOUS

## 2015-05-09 MED ORDER — ACETAMINOPHEN 10 MG/ML IV SOLN
INTRAVENOUS | Status: AC
  Filled 2015-05-09: qty 100

## 2015-05-09 MED ORDER — ONDANSETRON HCL 4 MG/2ML IJ SOLN: 4.0000 mg | Freq: Once | INTRAMUSCULAR | Status: DC | PRN

## 2015-05-09 MED ORDER — FENTANYL CITRATE (PF) 100 MCG/2ML IJ SOLN
INTRAMUSCULAR | Status: AC
  Administered 2015-05-09: 25 ug via INTRAVENOUS
  Filled 2015-05-09: qty 2

## 2015-05-09 MED ORDER — LIDOCAINE HCL (CARDIAC) 20 MG/ML IV SOLN
INTRAVENOUS | Status: DC | PRN
  Administered 2015-05-09: 100 mg via INTRAVENOUS

## 2015-05-09 MED ORDER — PROPOFOL 10 MG/ML IV BOLUS
INTRAVENOUS | Status: DC | PRN
  Administered 2015-05-09: 30 mg via INTRAVENOUS
  Administered 2015-05-09: 150 mg via INTRAVENOUS

## 2015-05-09 MED ORDER — BUPIVACAINE HCL (PF) 0.5 % IJ SOLN
INTRAMUSCULAR | Status: AC
  Filled 2015-05-09: qty 30

## 2015-05-09 MED ORDER — LACTATED RINGERS IV SOLN
INTRAVENOUS | Status: DC
  Administered 2015-05-09 (×2): via INTRAVENOUS

## 2015-05-09 MED ORDER — FAMOTIDINE 20 MG PO TABS
20.0000 mg | ORAL_TABLET | Freq: Once | ORAL | Status: AC
Start: 2015-05-09 — End: 2015-05-09
  Administered 2015-05-09: 20 mg via ORAL

## 2015-05-09 MED ORDER — EPHEDRINE SULFATE 50 MG/ML IJ SOLN
INTRAMUSCULAR | Status: DC | PRN
  Administered 2015-05-09: 10 mg via INTRAVENOUS

## 2015-05-09 MED ORDER — TRAMADOL HCL 50 MG PO TABS
ORAL_TABLET | ORAL | Status: AC
  Filled 2015-05-09: qty 1

## 2015-05-09 MED ORDER — SUGAMMADEX SODIUM 200 MG/2ML IV SOLN
INTRAVENOUS | Status: DC | PRN
Start: 2015-05-09 — End: 2015-05-09
  Administered 2015-05-09: 190 mg via INTRAVENOUS

## 2015-05-09 MED ORDER — FENTANYL CITRATE (PF) 100 MCG/2ML IJ SOLN
25.0000 ug | INTRAMUSCULAR | Status: DC | PRN
  Administered 2015-05-09 (×4): 25 ug via INTRAVENOUS

## 2015-05-09 MED ORDER — FAMOTIDINE 20 MG PO TABS
ORAL_TABLET | ORAL | Status: AC
  Filled 2015-05-09: qty 1

## 2015-05-09 MED ORDER — CHLORHEXIDINE GLUCONATE 4 % EX LIQD: 1.0000 "application " | Freq: Once | CUTANEOUS | Status: DC

## 2015-05-09 MED ORDER — FENTANYL CITRATE (PF) 100 MCG/2ML IJ SOLN
INTRAMUSCULAR | Status: DC | PRN
  Administered 2015-05-09 (×2): 50 ug via INTRAVENOUS

## 2015-05-09 MED ORDER — ONDANSETRON HCL 4 MG/2ML IJ SOLN
INTRAMUSCULAR | Status: DC | PRN
  Administered 2015-05-09: 4 mg via INTRAVENOUS

## 2015-05-09 MED ORDER — TRAMADOL HCL 50 MG PO TABS
50.0000 mg | ORAL_TABLET | Freq: Four times a day (QID) | ORAL | Status: DC | PRN
  Administered 2015-05-09: 50 mg via ORAL
  Filled 2015-05-09 (×2): qty 1

## 2015-05-09 MED ORDER — MIDAZOLAM HCL 2 MG/2ML IJ SOLN
INTRAMUSCULAR | Status: DC | PRN
  Administered 2015-05-09: 2 mg via INTRAVENOUS

## 2015-05-09 MED ORDER — ACETAMINOPHEN 10 MG/ML IV SOLN
INTRAVENOUS | Status: DC | PRN
  Administered 2015-05-09: 1000 mg via INTRAVENOUS

## 2015-05-09 SURGICAL SUPPLY — 57 items
CANISTER SUCT 1200ML W/VALVE (MISCELLANEOUS) ×3 IMPLANT
CHLORAPREP W/TINT 26ML (MISCELLANEOUS) ×3 IMPLANT
CLOSURE WOUND 1/2 X4 (GAUZE/BANDAGES/DRESSINGS)
CNTNR SPEC 2.5X3XGRAD LEK (MISCELLANEOUS) ×2
CONT SPEC 4OZ STER OR WHT (MISCELLANEOUS) ×4
CONT SPEC 4OZ STRL OR WHT (MISCELLANEOUS) ×2
CONTAINER SPEC 2.5X3XGRAD LEK (MISCELLANEOUS) ×1 IMPLANT
COVER PROBE FLX POLY STRL (MISCELLANEOUS) IMPLANT
DRAIN TLS ROUND 10FR (DRAIN) IMPLANT
DRAPE LAPAROTOMY TRNSV 106X77 (MISCELLANEOUS) ×3 IMPLANT
DRAPE MAG INST 16X20 L/F (DRAPES) ×3 IMPLANT
DRAPE SHEET LG 3/4 BI-LAMINATE (DRAPES) IMPLANT
DRSG TELFA 3X8 NADH (GAUZE/BANDAGES/DRESSINGS) IMPLANT
ELECT REM PT RETURN 9FT ADLT (ELECTROSURGICAL) ×3
ELECTRODE REM PT RTRN 9FT ADLT (ELECTROSURGICAL) ×1 IMPLANT
GAUZE SPONGE 4X4 12PLY STRL (GAUZE/BANDAGES/DRESSINGS) IMPLANT
GLOVE BIO SURGEON STRL SZ7 (GLOVE) ×17 IMPLANT
GLOVE BIO SURGEON STRL SZ7.5 (GLOVE) ×1 IMPLANT
GLOVE INDICATOR 8.0 STRL GRN (GLOVE) ×1 IMPLANT
GOWN STRL REUS W/ TWL LRG LVL3 (GOWN DISPOSABLE) ×2 IMPLANT
GOWN STRL REUS W/TWL LRG LVL3 (GOWN DISPOSABLE) ×12
KIT RM TURNOVER STRD PROC AR (KITS) ×3 IMPLANT
LABEL OR SOLS (LABEL) IMPLANT
LIQUID BAND (GAUZE/BANDAGES/DRESSINGS) ×3 IMPLANT
NDL HYPO 25X1 1.5 SAFETY (NEEDLE) IMPLANT
NEEDLE HYPO 25X1 1.5 SAFETY (NEEDLE) ×3 IMPLANT
NS IRRIG 500ML POUR BTL (IV SOLUTION) ×3 IMPLANT
PACK BASIN MINOR ARMC (MISCELLANEOUS) ×3 IMPLANT
PAD DRESSING TELFA 3X8 NADH (GAUZE/BANDAGES/DRESSINGS) IMPLANT
SET INFUS ANGEL WING 19GX3/4 8 (SET/KITS/TRAYS/PACK) IMPLANT
SHEARS HARMONIC 9CM CVD (BLADE) ×2 IMPLANT
SLEVE PROBE SENORX GAMMA FIND (MISCELLANEOUS) IMPLANT
SPONGE KITTNER 5P (MISCELLANEOUS) ×3 IMPLANT
STRIP CLOSURE SKIN 1/2X4 (GAUZE/BANDAGES/DRESSINGS) IMPLANT
SUCTION FRAZIER HANDLE 10FR (MISCELLANEOUS)
SUCTION TUBE FRAZIER 10FR DISP (MISCELLANEOUS) ×1 IMPLANT
SUT ETHILON 3-0 FS-10 30 BLK (SUTURE) ×3
SUT MNCRL 4-0 (SUTURE) ×3
SUT MNCRL 4-0 27XMFL (SUTURE) ×1
SUT SILK 2 0 (SUTURE) ×3
SUT SILK 2-0 18XBRD TIE 12 (SUTURE) ×1 IMPLANT
SUT SILK 3 0 (SUTURE)
SUT SILK 3-0 (SUTURE) ×1 IMPLANT
SUT SILK 3-0 18XBRD TIE 12 (SUTURE) ×1 IMPLANT
SUT SILK 4 0 (SUTURE)
SUT SILK 4-0 18XBRD TIE 12 (SUTURE) ×1 IMPLANT
SUT VIC AB 3-0 SH 27 (SUTURE) ×6
SUT VIC AB 3-0 SH 27X BRD (SUTURE) ×2 IMPLANT
SUT VIC AB 4-0 FS2 27 (SUTURE) ×3 IMPLANT
SUT VICRYL+ 3-0 144IN (SUTURE) ×3 IMPLANT
SUTURE EHLN 3-0 FS-10 30 BLK (SUTURE) ×1 IMPLANT
SUTURE MNCRL 4-0 27XMF (SUTURE) IMPLANT
SWABSTK COMLB BENZOIN TINCTURE (MISCELLANEOUS) IMPLANT
SYR BULB IRRIG 60ML STRL (SYRINGE) ×3 IMPLANT
SYR CONTROL 10ML (SYRINGE) ×2 IMPLANT
SYSTEM CHEST DRAIN TLS 7FR (DRAIN) IMPLANT
TAPE TRANSPORE STRL 2 31045 (GAUZE/BANDAGES/DRESSINGS) ×2 IMPLANT

## 2015-05-09 NOTE — Transfer of Care (Signed)
Immediate Anesthesia Transfer of Care Note  Patient: Jessica Haley  Procedure(s) Performed: Procedure(s): PARATHYROIDECTOMY (N/A)  Patient Location: PACU  Anesthesia Type:General  Level of Consciousness: awake, alert  and oriented  Airway & Oxygen Therapy: Patient Spontanous Breathing and Patient connected to face mask oxygen  Post-op Assessment: Report given to RN and Post -op Vital signs reviewed and stable  Post vital signs: Reviewed and stable  Last Vitals:  Filed Vitals:   05/09/15 0623 05/09/15 0656  BP: 150/102 143/91  Pulse: 84   Temp: 36.4 C     Last Pain:  Filed Vitals:   05/09/15 0658  PainSc: 5          Complications: No apparent anesthesia complications

## 2015-05-09 NOTE — Anesthesia Procedure Notes (Signed)
Procedure Name: Intubation Date/Time: 05/09/2015 7:30 AM Performed by: [facility] Pre-anesthesia Checklist: Patient identified, Emergency Drugs available, Suction available and Patient being monitored Patient Re-evaluated:Patient Re-evaluated prior to inductionOxygen Delivery Method: Circle system utilized Preoxygenation: Pre-oxygenation with 100% oxygen Intubation Type: IV induction Ventilation: Mask ventilation without difficulty Laryngoscope Size: Mac and 3 Grade View: Grade I Tube type: Oral Tube size: 7.0 mm Number of attempts: 1 Airway Equipment and Method: Patient positioned with wedge pillow and Stylet Placement Confirmation: ETT inserted through vocal cords under direct vision,  positive ETCO2 and breath sounds checked- equal and bilateral Secured at: 21 cm Tube secured with: Tape Dental Injury: Teeth and Oropharynx as per pre-operative assessment  Difficulty Due To: Difficulty was unanticipated

## 2015-05-09 NOTE — Anesthesia Postprocedure Evaluation (Signed)
Anesthesia Post Note  Patient: Jessica Haley  Procedure(s) Performed: Procedure(s) (LRB): PARATHYROIDECTOMY (N/A)  Patient location during evaluation: PACU Anesthesia Type: General Level of consciousness: awake and alert Pain management: pain level controlled Vital Signs Assessment: post-procedure vital signs reviewed and stable Respiratory status: spontaneous breathing, nonlabored ventilation, respiratory function stable and patient connected to nasal cannula oxygen Cardiovascular status: blood pressure returned to baseline and stable Postop Assessment: no signs of nausea or vomiting Anesthetic complications: no    Last Vitals:  Filed Vitals:   05/09/15 1042 05/09/15 1114  BP: 138/74 124/66  Pulse: 80 83  Temp:    Resp: 14 14    Last Pain:  Filed Vitals:   05/09/15 1116  PainSc: 3                  Alynn Ellithorpe S

## 2015-05-09 NOTE — H&P (View-Only) (Signed)
Patient ID: Jessica Haley, female   DOB: 03/21/1960, 55 y.o.   MRN: [id number]  Chief Complaint  Patient presents with  . Other    hyperparathyroidism     HPI Jessica Haley is a 55 y.o. female here today for thyroid issues. She state she has been having issues for a year or two. She was told she high calcium in her blood. She has been really tired, forgetful. She has been seeing a pain management clinic in [place] for chronic pain associated with a prior right forearm wrist injury.  She does not get regular mammograms, she states her last one was a few years ago.  I have reviewed the history of present illness with the patient.  HPI  Past Medical History  Diagnosis Date  . Arthritis   . Chronic kidney disease     Stones    Past Surgical History  Procedure Laterality Date  . Cholecystectomy  1994  . Fracture surgery  1999    screws in arm    History reviewed. No pertinent family history.  Social History Social History  Substance Use Topics  . Smoking status: Current Every Day Smoker -- 5 years    Types: Cigarettes  . Smokeless tobacco: None  . Alcohol Use: 0.6 - 1.2 oz/week    1-2 Standard drinks or equivalent per week    No Active Allergies  Current Outpatient Prescriptions  Medication Sig Dispense Refill  . meloxicam (MOBIC) 15 MG tablet Take 15 mg by mouth daily.    Marland Kitchen oxyCODONE (OXYCONTIN) 20 mg 12 hr tablet     . venlafaxine XR (EFFEXOR-XR) 75 MG 24 hr capsule     . Oxycodone HCl 10 MG TABS Take by mouth.     No current facility-administered medications for this visit.    Review of Systems Review of Systems  Constitutional: Positive for fatigue.  Respiratory: Negative.   Cardiovascular: Negative.     Blood pressure 138/72, pulse 88, resp. rate 12, height 5\' 7"  (1.702 m), weight 213 lb (96.616 kg).  Physical Exam Physical Exam  Constitutional: She is oriented to person, place, and time. She appears well-developed and well-nourished.  Eyes: Conjunctivae  are normal. No scleral icterus.  Neck: Neck supple. No thyromegaly present.  Cardiovascular: Normal rate, regular rhythm and normal heart sounds.   Pulmonary/Chest: Effort normal and breath sounds normal.  Abdominal: Soft. She exhibits no mass. There is no hepatomegaly. There is no tenderness.  Lymphadenopathy:    She has no cervical adenopathy.  Neurological: She is alert and oriented to person, place, and time.  Skin: Skin is warm and dry.    Data Reviewed Notes and Labs reviewed.  Neck US as performed. There is a dominant left thyroid nodule with mixed echogenicity, a cyst in right lobe. There ae other 2-50mm nodules on both sides. No obvious suggestion of a parathyroid mass Assessment    Hypercalcemia. Elevated PTH 2 mos ago. Ca levels around 11.    Thyroid nodule.   Plan   Discussed findings fully with pt.  Arrange to repeat CA, PTH, T4,TSH. Parathyroid scan.      Patient has been scheduled for a parathyroid scan at [facility] for Wednesday, 04-20-15 at 11 am (arrive 10:30 am). Prep: NPO after midnight.   PCP: Dr. Caryl Comes This information has been scribed by Verlene Mayer, CMA      [facility] 04/13/2015, 11:17 AM

## 2015-05-09 NOTE — Interval H&P Note (Signed)
History and Physical Interval Note:  05/09/2015 6:49 AM  Jessica Haley  has presented today for surgery, with the diagnosis of hypercalcemia  The various methods of treatment have been discussed with the patient and family. After consideration of risks, benefits and other options for treatment, the patient has consented to  Procedure(s): PARATHYROIDECTOMY (N/A) as a surgical intervention .  The patient's history has been reviewed, patient examined, no change in status, stable for surgery.  I have reviewed the patient's chart and labs.  Questions were answered to the patient's satisfaction.     SANKAR,SEEPLAPUTHUR G

## 2015-05-09 NOTE — OR Nursing (Signed)
Patient voided 400 ml. 

## 2015-05-09 NOTE — Discharge Instructions (Signed)
AMBULATORY SURGERY  DISCHARGE INSTRUCTIONS   1) The drugs that you were given will stay in your system until tomorrow so for the next 24 hours you should not:  A) Drive an automobile B) Make any legal decisions C) Drink any alcoholic beverage   2) You may resume regular meals tomorrow.  Today it is better to start with liquids and gradually work up to solid foods.  You may eat anything you prefer, but it is better to start with liquids, then soup and crackers, and gradually work up to solid foods.   3) Please notify your doctor immediately if you have any unusual bleeding, trouble breathing, redness and pain at the surgery site, drainage, fever, or pain not relieved by medication.   Please contact your physician with any problems or Same Day Surgery at [phone], Monday through Friday 6 am to 4 pm, or [place] at [facility] number at [phone].  Parathyroidectomy, Care After Refer to this sheet in the next few weeks. These instructions provide you with information on caring for yourself after your procedure. Your health care provider may also give you more specific instructions. Your treatment has been planned according to current medical practices, but problems sometimes occur. Call your health care provider if you have any problems or questions after your procedure. WHAT TO EXPECT AFTER THE PROCEDURE After your procedure, it is typical to have the following:  Tingling or numbness around your mouth or in your fingers or toes.  Temporary hoarseness. HOME CARE INSTRUCTIONS  Take medicines only as directed by your health care provider.  There are many different ways to close and cover an incision, including stitches, skin glue, and adhesive strips. Follow your health care provider's instructions on:  Incision care.  Bandage (dressing) changes and removal.  Incision closure removal.  Do not take baths, swim, or use a hot tub until your health care provider  approves.  Follow your health care provider's instructions for eating and drinking. You may need to consume only liquids and soft foods for a day after the procedure.  Rest for the first few days as you heal from the surgery. It may take that long before you can resume all of your usual activities.  Keep all follow-up visits as directed by your health care provider. This is important. Your health care provider needs to monitor the calcium level in your blood to make sure that it does not become low. SEEK MEDICAL CARE IF:  You have tingling or numbness in your lips, toes, or fingers that does not go away within a few days.  You have drainage, redness, swelling, or pain at your incision site.  You have trouble talking.  You have nausea or vomiting for more than 2 days.  You have a fever. SEEK IMMEDIATE MEDICAL CARE IF: You have trouble breathing.    This information is not intended to replace advice given to you by your health care provider. Make sure you discuss any questions you have with your health care provider.   Document Released: 07/15/2013 Document Reviewed: 07/15/2013 Elsevier Interactive Patient Education [employer].

## 2015-05-09 NOTE — Anesthesia Preprocedure Evaluation (Signed)
Anesthesia Evaluation  Patient identified by MRN, date of birth, ID band Patient awake    Reviewed: Allergy & Precautions, NPO status , Patient's Chart, lab work & pertinent test results, reviewed documented beta blocker date and time   Airway Mallampati: III  TM Distance: >3 FB     Dental  (+) Chipped   Pulmonary Current Smoker,           Cardiovascular hypertension, Pt. on medications      Neuro/Psych  Headaches,    GI/Hepatic   Endo/Other    Renal/GU Renal InsufficiencyRenal disease     Musculoskeletal  (+) Arthritis ,   Abdominal   Peds  Hematology   Anesthesia Other Findings Obese.  Reproductive/Obstetrics                             Anesthesia Physical Anesthesia Plan  ASA: III  Anesthesia Plan: General   Post-op Pain Management:    Induction: Intravenous  Airway Management Planned: Oral ETT  Additional Equipment:   Intra-op Plan:   Post-operative Plan:   Informed Consent: I have reviewed the patients History and Physical, chart, labs and discussed the procedure including the risks, benefits and alternatives for the proposed anesthesia with the patient or authorized representative who has indicated his/her understanding and acceptance.     Plan Discussed with: CRNA  Anesthesia Plan Comments:         Anesthesia Quick Evaluation

## 2015-05-09 NOTE — Interval H&P Note (Signed)
History and Physical Interval Note:  05/09/2015 6:50 AM  Jessica Haley  has presented today for surgery, with the diagnosis of hypercalcemia  The various methods of treatment have been discussed with the patient and family. After consideration of risks, benefits and other options for treatment, the patient has consented to  Procedure(s): PARATHYROIDECTOMY (N/A) as a surgical intervention .  The patient's history has been reviewed, patient examined, no change in status, stable for surgery.  I have reviewed the patient's chart and labs.  Questions were answered to the patient's satisfaction.     SANKAR,SEEPLAPUTHUR G

## 2015-05-09 NOTE — Op Note (Signed)
Preop diagnosis: Parathyroid adenoma  Post op diagnosis: Same  Operation: Excision parathyroid adenoma  Surgeon: S.G.Zhara Gieske  Assistant: Arvilla Meres, RN,FA   Anesthesia: Gen.  Complications: None  EBL: Minimal  Drains: None  Description: Patient was put to sleep in supine position and then placed with the night slightly extended. The neck was then prepped and draped sterile field and timeout performed. The patient had a parathyroid scan that showed evidence of uptake in the superior portion of the right lower thyroid with a nodule identified on the corresponding CT just behind the thyroid. Accordingly this area was mapped out with ultrasound and skin incision was then made along the right side of the neck along the skin crease overlying the midportion of the thyroid gland. Incision was deepened through the layers and then the platysma and was elevated on both sides. The midline was identified over the trachea which was then incised longitudinally. The strap muscles on the right were then retracted laterally to expose the thyroid gland. This was then sent lifted up and towards the left side to expose the posterior aspect and dissection was carried out with the use of the harmonic device and the use of a blunt dissection. Initially a tiny nodular area was identified near the inferior pole of the right lobe in the posterior aspect however this was excised and frozen section showed this was a thyroid nodule. Dissection was carried upward towards the superior pole where the previous imaging as suggested the source of the abnormal. In the superior pole region behind was a 78 mm sized nodular parathyroid gland with a typical color and this was then freed and excised out completely. Frozen section confirmed this to be a hypercellular parathyroid gland and consistent with the clinical findings of an adenoma. After ensuring proper hemostasis the midline fascial opening was closed with a running 3-0 Vicryl.  Platysma layer closed with 3-0 Vicryl. Skin closed with subcuticular 4-0 Monocryl covered with liquid ban. Patient subsequently returned recovery room stable condition.

## 2015-05-09 NOTE — H&P (View-Only) (Signed)
This is a 55 year old female here today for her follow up parathyroid scan done on 04/20/15. I have reviewed the history of present illness with the patient.   Scan was reviewed showing persistent uptake in the right posterior thyroid region\ With suspicion of a 51mm nodule behind the right lower lobe of the thyroid, consitent with parathyroid adenoma.  No apparent thyroid nodules were noted on this study. Patient had repeat PTH and Ca levels drawn today.  Discussed excision of parathyroid adenoma and patient is agreeable Procedure risks and benefits were explained   Patient is scheduled for surgery at [facility] on 05/09/15. She will pre admit by phone. Patient is aware of date and instructions.   PCP:  Ramonita Lab  This information has been scribed by Gaspar Cola CMA.

## 2015-05-09 NOTE — OR Nursing (Signed)
Ice pack in place

## 2015-05-10 ENCOUNTER — Telehealth: Payer: Self-pay | Admitting: *Deleted

## 2015-05-10 ENCOUNTER — Encounter: Payer: Self-pay | Admitting: General Surgery

## 2015-05-10 LAB — SURGICAL PATHOLOGY

## 2015-05-10 NOTE — Telephone Encounter (Signed)
Notified patient as instructed, patient pleased. Discussed follow-up appointments, patient agrees  

## 2015-05-10 NOTE — Telephone Encounter (Signed)
-----   Message from Christene Lye, MD sent at 05/10/2015  4:25 PM EDT ----- Please let pt pt know the pathology confirmed an adenomna

## 2015-05-16 ENCOUNTER — Ambulatory Visit: Payer: Managed Care, Other (non HMO) | Admitting: General Surgery

## 2015-05-18 ENCOUNTER — Ambulatory Visit (INDEPENDENT_AMBULATORY_CARE_PROVIDER_SITE_OTHER): Payer: Managed Care, Other (non HMO) | Admitting: General Surgery

## 2015-05-18 ENCOUNTER — Encounter: Payer: Self-pay | Admitting: General Surgery

## 2015-05-18 VITALS — BP 130/82 | HR 80 | Resp 14 | Ht 67.0 in | Wt 208.0 lb

## 2015-05-18 DIAGNOSIS — E041 Nontoxic single thyroid nodule: Secondary | ICD-10-CM

## 2015-05-18 NOTE — Progress Notes (Signed)
Patient ID: Jessica Haley, female   DOB: 12-Aug-1960, 55 y.o.   MRN: [id number] Patient here for a post op visit. She had a excision parathyroid adenoma done on 05/09/15. She states the area is still sore.  Otherwise doing well. I have reviewed the history of present illness with the patient.   Incision healing well.  No signs of infection or hematoma/seroma. Check Calcium level and PTH. Her immediate post op Ca was 10.0  Return in one month. Return to work 05/31/15.  This information has been scribed by Verlene Mayer, CMA      PCP: Dr. Ramonita Lab

## 2015-05-18 NOTE — Patient Instructions (Signed)
Follow up in one month.

## 2015-05-19 ENCOUNTER — Telehealth: Payer: Self-pay | Admitting: *Deleted

## 2015-05-19 LAB — PTH, INTACT AND CALCIUM
Calcium: 8.9 mg/dL (ref 8.7–10.2)
PTH: 36 pg/mL (ref 15–65)

## 2015-05-19 NOTE — Progress Notes (Signed)
Quick Note:  Jessica Haley, please inform pt labs are normal. F/u as scheduled ______

## 2015-05-19 NOTE — Telephone Encounter (Signed)
Patient notified as instructed and she verbalizes understanding.

## 2015-05-19 NOTE — Telephone Encounter (Signed)
-----   Message from Christene Lye, MD sent at 05/19/2015  8:35 AM EDT ----- Jessica Haley, please inform pt labs are normal. F/u as scheduled

## 2015-06-16 ENCOUNTER — Encounter: Payer: Self-pay | Admitting: *Deleted

## 2015-06-22 ENCOUNTER — Ambulatory Visit: Payer: Managed Care, Other (non HMO) | Admitting: General Surgery

## 2018-06-17 ENCOUNTER — Telehealth: Payer: Self-pay

## 2018-06-17 NOTE — Telephone Encounter (Signed)
Patient stated she has an appointment today at 3pm and that she would be receiving a COVID test. Patient does c/o sore throat.

## 2018-10-26 LAB — POCT LIPID PANEL
HDL: 45
LDL: 67
Non-HDL: 125
POC Glucose: 94 mg/dl (ref 70–99)
TC/HDL: 3.8
TC: 170
TRG: 287

## 2018-11-05 ENCOUNTER — Other Ambulatory Visit: Payer: Self-pay

## 2018-11-05 DIAGNOSIS — Z008 Encounter for other general examination: Secondary | ICD-10-CM

## 2018-11-05 NOTE — Progress Notes (Signed)
     Patient ID: Jessica Haley, female    DOB: [birth year], 58 y.o.   MRN: [id number]    Thank you!!  [place] [facility] Specialist [place]: [phone]  Cell:  [phone] Website: [email or web address]

## 2018-12-30 ENCOUNTER — Other Ambulatory Visit: Payer: Managed Care, Other (non HMO)

## 2019-01-01 ENCOUNTER — Ambulatory Visit: Payer: BC Managed Care – PPO | Attending: Internal Medicine

## 2019-01-01 ENCOUNTER — Telehealth: Payer: Self-pay

## 2019-01-01 DIAGNOSIS — Z20822 Contact with and (suspected) exposure to covid-19: Secondary | ICD-10-CM

## 2019-01-01 NOTE — Telephone Encounter (Signed)
Placed follow up call to check on patient to see how she is doing. Stated that she is just now starting to feel well enough to be tested for covid. She has an appointment at 7 at [facility] for testing.

## 2019-01-04 ENCOUNTER — Telehealth: Payer: Self-pay

## 2019-01-04 NOTE — Telephone Encounter (Signed)
Pt called for covid results - advised pt that results are not back- MyChart enrollment info sent via email.

## 2019-01-04 NOTE — Telephone Encounter (Signed)
Received call asking about test results. Updated that no information available at this time. States she still feels "under the weather" at this time.

## 2019-01-06 ENCOUNTER — Telehealth: Payer: Self-pay

## 2019-01-06 ENCOUNTER — Ambulatory Visit: Payer: BC Managed Care – PPO | Attending: Internal Medicine

## 2019-01-06 DIAGNOSIS — Z20822 Contact with and (suspected) exposure to covid-19: Secondary | ICD-10-CM

## 2019-01-06 NOTE — Telephone Encounter (Signed)
Pt call to get test results. She had a test done at [facility] on 12/31 and will need to be retested.   Call was transferred to nurse to reschedule.   [place]

## 2019-01-07 LAB — NOVEL CORONAVIRUS, NAA

## 2019-01-08 ENCOUNTER — Telehealth: Payer: Self-pay | Admitting: *Deleted

## 2019-01-08 LAB — NOVEL CORONAVIRUS, NAA: SARS-CoV-2, NAA: NOT DETECTED

## 2019-01-08 NOTE — Telephone Encounter (Signed)
Patient called given negative covid results ,

## 2019-02-27 ENCOUNTER — Other Ambulatory Visit: Payer: Self-pay

## 2019-02-27 ENCOUNTER — Emergency Department: Payer: BC Managed Care – PPO

## 2019-02-27 ENCOUNTER — Encounter: Payer: Self-pay | Admitting: Emergency Medicine

## 2019-02-27 ENCOUNTER — Emergency Department
Admission: EM | Admit: 2019-02-27 | Discharge: 2019-02-27 | Disposition: A | Discharge status: Home / Self Care | Payer: BC Managed Care – PPO | Attending: Emergency Medicine | Admitting: Emergency Medicine

## 2019-02-27 DIAGNOSIS — Y9301 Activity, walking, marching and hiking: Secondary | ICD-10-CM | POA: Insufficient documentation

## 2019-02-27 DIAGNOSIS — F17210 Nicotine dependence, cigarettes, uncomplicated: Secondary | ICD-10-CM | POA: Insufficient documentation

## 2019-02-27 DIAGNOSIS — Z79899 Other long term (current) drug therapy: Secondary | ICD-10-CM | POA: Diagnosis not present

## 2019-02-27 DIAGNOSIS — I129 Hypertensive chronic kidney disease with stage 1 through stage 4 chronic kidney disease, or unspecified chronic kidney disease: Secondary | ICD-10-CM | POA: Insufficient documentation

## 2019-02-27 DIAGNOSIS — Y929 Unspecified place or not applicable: Secondary | ICD-10-CM | POA: Diagnosis not present

## 2019-02-27 DIAGNOSIS — N189 Chronic kidney disease, unspecified: Secondary | ICD-10-CM | POA: Insufficient documentation

## 2019-02-27 DIAGNOSIS — S0083XA Contusion of other part of head, initial encounter: Secondary | ICD-10-CM | POA: Diagnosis not present

## 2019-02-27 DIAGNOSIS — Y998 Other external cause status: Secondary | ICD-10-CM | POA: Insufficient documentation

## 2019-02-27 DIAGNOSIS — W109XXA Fall (on) (from) unspecified stairs and steps, initial encounter: Secondary | ICD-10-CM | POA: Insufficient documentation

## 2019-02-27 DIAGNOSIS — W19XXXA Unspecified fall, initial encounter: Secondary | ICD-10-CM

## 2019-02-27 DIAGNOSIS — S0990XA Unspecified injury of head, initial encounter: Secondary | ICD-10-CM | POA: Diagnosis present

## 2019-02-27 MED ORDER — HYDROCODONE-ACETAMINOPHEN 5-325 MG PO TABS: 1.0000 | ORAL_TABLET | Freq: Four times a day (QID) | ORAL | 0 refills | Status: AC | PRN

## 2019-02-27 NOTE — ED Triage Notes (Signed)
Pt here after tripping and falling when on second step.  Significant swelling to left face.  Happened at 1430 and swelling has been getting worse.  Swelling extends from left periorbital with bruising down to left jaw.  Chipped tooth.  No blood thinners. No LOC.

## 2019-02-27 NOTE — ED Provider Notes (Signed)
Emergency Department Provider Note  ____________________________________________  Time seen: Approximately 6:24 PM  I have reviewed the triage vital signs and the nursing notes.   HISTORY  Chief Complaint Fall   Historian Patient    HPI Jessica Haley is a 59 y.o. female presents to the emergency department after she reports that she tripped and fell down her second step.  Patient has ecchymosis around left eye and states that she avulsed the several teeth.  She denies loss of consciousness.  Denies chest pain, chest tightness or abdominal pain.  She has been able to ambulate since injury occurred. No other alleviating measures have been attempted.    Past Medical History:  Diagnosis Date  . Arthritis   . Chronic kidney disease    Stones  . Chronic pain due to injury    arm-pt taking Oxycontin bid and Oxycodone prn  . Headache    H/O MIGRAINES  . Hypertension    h/o-off bp meds x 5 years-states bp under good control-last bp 128/74 on 04-26-15     Immunizations up to date:  Yes.     Past Medical History:  Diagnosis Date  . Arthritis   . Chronic kidney disease    Stones  . Chronic pain due to injury    arm-pt taking Oxycontin bid and Oxycodone prn  . Headache    H/O MIGRAINES  . Hypertension    h/o-off bp meds x 5 years-states bp under good control-last bp 128/74 on 04-26-15    Patient Active Problem List   Diagnosis Date Noted  . Osteoarthrosis, unspecified whether generalized or localized, hand 12/22/2007  . PAIN IN JOINT, HAND 02/10/2007    Past Surgical History:  Procedure Laterality Date  . CHOLECYSTECTOMY  1994  . FRACTURE SURGERY  1999   screws in arm  . PARATHYROIDECTOMY N/A 05/09/2015   Procedure: PARATHYROIDECTOMY;  Surgeon: Christene Lye, MD;  Location: [facility] ORS;  Service: General;  Laterality: N/A;    Prior to Admission medications   Medication Sig Start Date End Date Taking? Authorizing Provider  HYDROcodone-acetaminophen (NORCO)  5-325 MG tablet Take 1 tablet by mouth every 6 (six) hours as needed for up to 2 days for moderate pain. 02/27/19 03/01/19  Lannie Fields, PA-C  meloxicam (MOBIC) 15 MG tablet Take 15 mg by mouth daily.    [provider]  oxyCODONE (OXYCONTIN) 20 mg 12 hr tablet Take 20 mg by mouth every 12 (twelve) hours.  01/17/15   [provider]  Oxycodone HCl 10 MG TABS Take 10 mg by mouth as needed.     [provider]  venlafaxine XR (EFFEXOR-XR) 75 MG 24 hr capsule Take 150 mg by mouth daily with breakfast.  04/07/15   [provider]    Allergies Patient has no known allergies.  History reviewed. No pertinent family history.  Social History Social History   Tobacco Use  . Smoking status: Current Every Day Smoker    Packs/day: 0.50    Years: 5.00    Pack years: 2.50    Types: Cigarettes  Substance Use Topics  . Alcohol use: No    Alcohol/week: 1.0 - 2.0 standard drinks    Types: 1 - 2 Standard drinks or equivalent per week  . Drug use: No     Review of Systems  Constitutional: No fever/chills Eyes:  No discharge ENT: No upper respiratory complaints. Respiratory: no cough. No SOB/ use of accessory muscles to breath Gastrointestinal:   No nausea, no  vomiting.  No diarrhea.  No constipation. Musculoskeletal: Negative for musculoskeletal pain. Skin: Negative for rash, abrasions, lacerations, ecchymosis.    ____________________________________________   PHYSICAL EXAM:  VITAL SIGNS: ED Triage Vitals  Enc Vitals Group     BP 02/27/19 1700 (!) 143/85     Pulse Rate 02/27/19 1700 71     Resp 02/27/19 1700 16     Temp 02/27/19 1700 97.9 F (36.6 C)     Temp Source 02/27/19 1700 Oral     SpO2 02/27/19 1700 98 %     Weight 02/27/19 1658 200 lb (90.7 kg)     Height 02/27/19 1658 5\' 7"  (1.702 m)     Head Circumference --      Peak Flow --      Pain Score 02/27/19 1658 8     Pain Loc --      Pain Edu? --      Excl. in [place]? --       Constitutional: Alert and oriented. Well appearing and in no acute distress. Eyes: Conjunctivae are normal. PERRL. EOMI. Head: Atraumatic.  Patient has a left-sided periorbital edema and ecchymosis. ENT:      Ears:       Nose: No congestion/rhinnorhea.      Mouth/Throat: Mucous membranes are moist.  Patient has chipped superior 9. Neck: No stridor.  No cervical spine tenderness to palpation. Cardiovascular: Normal rate, regular rhythm. Normal S1 and S2.  Good peripheral circulation. Respiratory: Normal respiratory effort without tachypnea or retractions. Lungs CTAB. Good air entry to the bases with no decreased or absent breath sounds Gastrointestinal: Bowel sounds x 4 quadrants. Soft and nontender to palpation. No guarding or rigidity. No distention. Musculoskeletal: Full range of motion to all extremities. No obvious deformities noted Neurologic:  Normal for age. No gross focal neurologic deficits are appreciated.  Skin:  Skin is warm, dry and intact. No rash noted. Psychiatric: Mood and affect are normal for age. Speech and behavior are normal.   ____________________________________________   LABS (all labs ordered are listed, but only abnormal results are displayed)  Labs Reviewed - No data to display ____________________________________________  EKG   ____________________________________________  RADIOLOGY Unk Pinto, personally viewed and evaluated these images (plain radiographs) as part of my medical decision making, as well as reviewing the written report by the radiologist.  CT Head Wo Contrast  Result Date: 02/27/2019 CLINICAL DATA:  Status post fall.  Facial contusions. EXAM: CT HEAD WITHOUT CONTRAST CT MAXILLOFACIAL WITHOUT CONTRAST CT CERVICAL SPINE WITHOUT CONTRAST TECHNIQUE: Multidetector CT imaging of the head, cervical spine, and maxillofacial structures were performed using the standard protocol without intravenous contrast. Multiplanar CT image  reconstructions of the cervical spine and maxillofacial structures were also generated. COMPARISON:  None. FINDINGS: CT HEAD FINDINGS Brain: No evidence of acute infarction, hemorrhage, hydrocephalus, extra-axial collection or mass lesion/mass effect. Vascular: No hyperdense vessel or unexpected calcification. Skull: No osseous abnormality. Sinuses/Orbits: Visualized paranasal sinuses are clear. Visualized mastoid sinuses are clear. Visualized orbits demonstrate no focal abnormality. Other: None CT MAXILLOFACIAL FINDINGS Osseous: No fracture or mandibular dislocation. No destructive process. Orbits: Negative. No traumatic or inflammatory finding. Sinuses: Rightward deviation of the nasal septum. No paranasal sinus air-fluid level. Mild mucosal thickening in the inferior left maxillary sinus. Mastoid sinuses are clear. Soft tissues: Left infraorbital soft tissue contusion and a 2.2 cm hematoma in the left cheek. CT CERVICAL SPINE FINDINGS Alignment: No static listhesis. Loss of the normal cervical lordosis with mild reversal. Osteoarthritis  of bilateral temporomandibular joints. Skull base and vertebrae: No acute fracture. No primary bone lesion or focal pathologic process. Soft tissues and spinal canal: No prevertebral fluid or swelling. No visible canal hematoma. Disc levels: Degenerative disease with disc height loss at C4-5, C5-6 and C6-7. Bilateral uncovertebral degenerative changes at C3-4 with bilateral facet arthropathy and left foraminal stenosis. Broad-based disc osteophyte complex at C4-5, C5-6 and C6-7 with bilateral uncovertebral degenerative changes and bilateral foraminal stenosis. Severe right facet arthropathy at C2-3. Upper chest: Lung apices are clear. Other: No fluid collection or hematoma. IMPRESSION: 1. No acute intracranial pathology. 2.  No acute osseous injury of the maxillofacial bones. 3.  No acute osseous injury of the cervical spine. 4. Left infraorbital soft tissue contusion and a 2.2 cm  hematoma in the left cheek. Electronically Signed   By: Kathreen Devoid   On: 02/27/2019 17:50   CT Cervical Spine Wo Contrast  Result Date: 02/27/2019 CLINICAL DATA:  Status post fall.  Facial contusions. EXAM: CT HEAD WITHOUT CONTRAST CT MAXILLOFACIAL WITHOUT CONTRAST CT CERVICAL SPINE WITHOUT CONTRAST TECHNIQUE: Multidetector CT imaging of the head, cervical spine, and maxillofacial structures were performed using the standard protocol without intravenous contrast. Multiplanar CT image reconstructions of the cervical spine and maxillofacial structures were also generated. COMPARISON:  None. FINDINGS: CT HEAD FINDINGS Brain: No evidence of acute infarction, hemorrhage, hydrocephalus, extra-axial collection or mass lesion/mass effect. Vascular: No hyperdense vessel or unexpected calcification. Skull: No osseous abnormality. Sinuses/Orbits: Visualized paranasal sinuses are clear. Visualized mastoid sinuses are clear. Visualized orbits demonstrate no focal abnormality. Other: None CT MAXILLOFACIAL FINDINGS Osseous: No fracture or mandibular dislocation. No destructive process. Orbits: Negative. No traumatic or inflammatory finding. Sinuses: Rightward deviation of the nasal septum. No paranasal sinus air-fluid level. Mild mucosal thickening in the inferior left maxillary sinus. Mastoid sinuses are clear. Soft tissues: Left infraorbital soft tissue contusion and a 2.2 cm hematoma in the left cheek. CT CERVICAL SPINE FINDINGS Alignment: No static listhesis. Loss of the normal cervical lordosis with mild reversal. Osteoarthritis of bilateral temporomandibular joints. Skull base and vertebrae: No acute fracture. No primary bone lesion or focal pathologic process. Soft tissues and spinal canal: No prevertebral fluid or swelling. No visible canal hematoma. Disc levels: Degenerative disease with disc height loss at C4-5, C5-6 and C6-7. Bilateral uncovertebral degenerative changes at C3-4 with bilateral facet arthropathy and  left foraminal stenosis. Broad-based disc osteophyte complex at C4-5, C5-6 and C6-7 with bilateral uncovertebral degenerative changes and bilateral foraminal stenosis. Severe right facet arthropathy at C2-3. Upper chest: Lung apices are clear. Other: No fluid collection or hematoma. IMPRESSION: 1. No acute intracranial pathology. 2.  No acute osseous injury of the maxillofacial bones. 3.  No acute osseous injury of the cervical spine. 4. Left infraorbital soft tissue contusion and a 2.2 cm hematoma in the left cheek. Electronically Signed   By: Kathreen Devoid   On: 02/27/2019 17:50   CT Maxillofacial Wo Contrast  Result Date: 02/27/2019 CLINICAL DATA:  Status post fall.  Facial contusions. EXAM: CT HEAD WITHOUT CONTRAST CT MAXILLOFACIAL WITHOUT CONTRAST CT CERVICAL SPINE WITHOUT CONTRAST TECHNIQUE: Multidetector CT imaging of the head, cervical spine, and maxillofacial structures were performed using the standard protocol without intravenous contrast. Multiplanar CT image reconstructions of the cervical spine and maxillofacial structures were also generated. COMPARISON:  None. FINDINGS: CT HEAD FINDINGS Brain: No evidence of acute infarction, hemorrhage, hydrocephalus, extra-axial collection or mass lesion/mass effect. Vascular: No hyperdense vessel or unexpected calcification. Skull: No osseous abnormality.  Sinuses/Orbits: Visualized paranasal sinuses are clear. Visualized mastoid sinuses are clear. Visualized orbits demonstrate no focal abnormality. Other: None CT MAXILLOFACIAL FINDINGS Osseous: No fracture or mandibular dislocation. No destructive process. Orbits: Negative. No traumatic or inflammatory finding. Sinuses: Rightward deviation of the nasal septum. No paranasal sinus air-fluid level. Mild mucosal thickening in the inferior left maxillary sinus. Mastoid sinuses are clear. Soft tissues: Left infraorbital soft tissue contusion and a 2.2 cm hematoma in the left cheek. CT CERVICAL SPINE FINDINGS  Alignment: No static listhesis. Loss of the normal cervical lordosis with mild reversal. Osteoarthritis of bilateral temporomandibular joints. Skull base and vertebrae: No acute fracture. No primary bone lesion or focal pathologic process. Soft tissues and spinal canal: No prevertebral fluid or swelling. No visible canal hematoma. Disc levels: Degenerative disease with disc height loss at C4-5, C5-6 and C6-7. Bilateral uncovertebral degenerative changes at C3-4 with bilateral facet arthropathy and left foraminal stenosis. Broad-based disc osteophyte complex at C4-5, C5-6 and C6-7 with bilateral uncovertebral degenerative changes and bilateral foraminal stenosis. Severe right facet arthropathy at C2-3. Upper chest: Lung apices are clear. Other: No fluid collection or hematoma. IMPRESSION: 1. No acute intracranial pathology. 2.  No acute osseous injury of the maxillofacial bones. 3.  No acute osseous injury of the cervical spine. 4. Left infraorbital soft tissue contusion and a 2.2 cm hematoma in the left cheek. Electronically Signed   By: Kathreen Devoid   On: 02/27/2019 17:50    ____________________________________________    PROCEDURES  Procedure(s) performed:     Procedures     Medications - No data to display   ____________________________________________   INITIAL IMPRESSION / ASSESSMENT AND PLAN / ED COURSE  Pertinent labs & imaging results that were available during my care of the patient were reviewed by me and considered in my medical decision making (see chart for details).      Assessment and plan Fall Facial contusion 59 year old female presents to the emergency department with left-sided periorbital edema and ecchymosis along with an avulsed tooth.  CT head, max face and CT cervical spine revealed no acute abnormality.  Patient was discharged with Norco and a work note was provided.  Return precautions were given to return with new or worsening symptoms.  All patient  questions were answered.   ____________________________________________  FINAL CLINICAL IMPRESSION(S) / ED DIAGNOSES  Final diagnoses:  Fall, initial encounter  Contusion of face, initial encounter      NEW MEDICATIONS STARTED DURING THIS VISIT:  ED Discharge Orders         Ordered    HYDROcodone-acetaminophen (NORCO) 5-325 MG tablet  Every 6 hours PRN     02/27/19 1821              This chart was dictated using voice recognition software/Dragon. Despite best efforts to proofread, errors can occur which can change the meaning. Any change was purely unintentional.     Lannie Fields, PA-C 02/27/19 1829    Drenda Freeze, MD 02/28/19 [phone]

## 2019-02-27 NOTE — ED Triage Notes (Signed)
First Nurse Note:  States fell onto concrete about 2 hours PTA.  Left cheek swelling and left eye bruising seen.  AAOx3.  Skin warm and dry. NAD

## 2019-02-27 NOTE — ED Notes (Signed)
See triage note   Presents s/p fall   States she tripped and fell face first hit face on concrete    Swelling and bruising noted to left side of face  Chipped front tooth

## 2020-10-28 ENCOUNTER — Other Ambulatory Visit: Payer: Self-pay

## 2020-10-28 ENCOUNTER — Other Ambulatory Visit: Payer: Self-pay | Admitting: Internal Medicine

## 2020-10-28 ENCOUNTER — Ambulatory Visit
Admission: RE | Admit: 2020-10-28 | Discharge: 2020-10-28 | Disposition: A | Discharge status: Home / Self Care | Payer: BC Managed Care – PPO | Source: Ambulatory Visit | Attending: Internal Medicine | Admitting: Internal Medicine

## 2020-10-28 DIAGNOSIS — Z1231 Encounter for screening mammogram for malignant neoplasm of breast: Secondary | ICD-10-CM

## 2022-07-19 IMAGING — MG MM DIGITAL SCREENING BILAT W/ TOMO AND CAD
8 series · 8 of 24 positions shown · non-contrast
Comparison: Previous exam(s).

ACR Breast Density Category a: The breast tissue is almost entirely
fatty.

CLINICAL DATA: Screening.

EXAM:
DIGITAL SCREENING BILATERAL MAMMOGRAM WITH TOMOSYNTHESIS AND CAD
TECHNIQUE: Bilateral screening digital craniocaudal and mediolateral oblique
mammograms were obtained. Bilateral screening digital breast
tomosynthesis was performed. The images were evaluated with
computer-aided detection.

[L MLO synth-2D]
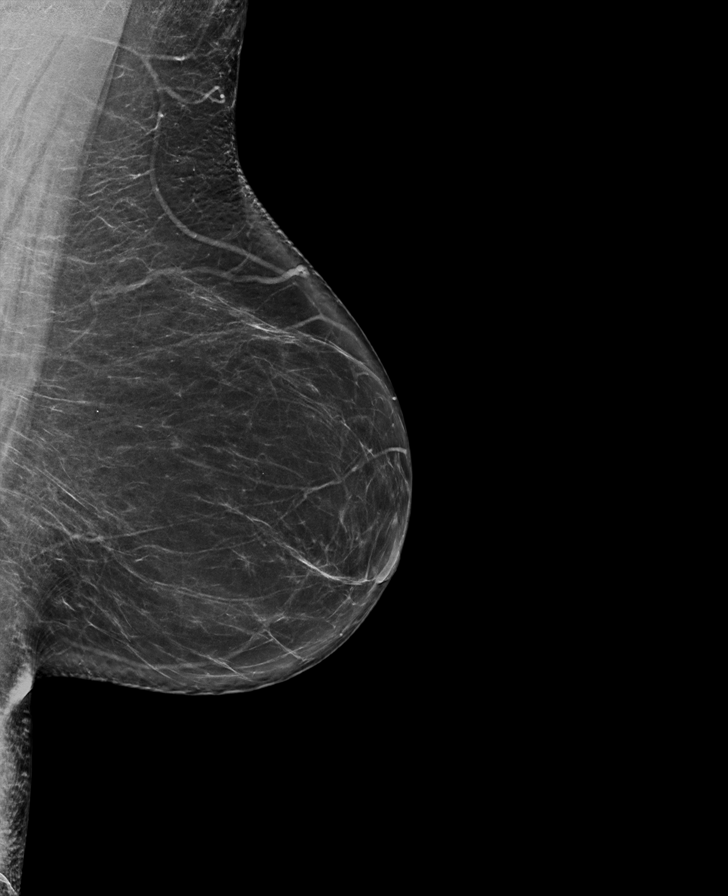

[L CC synth-2D]
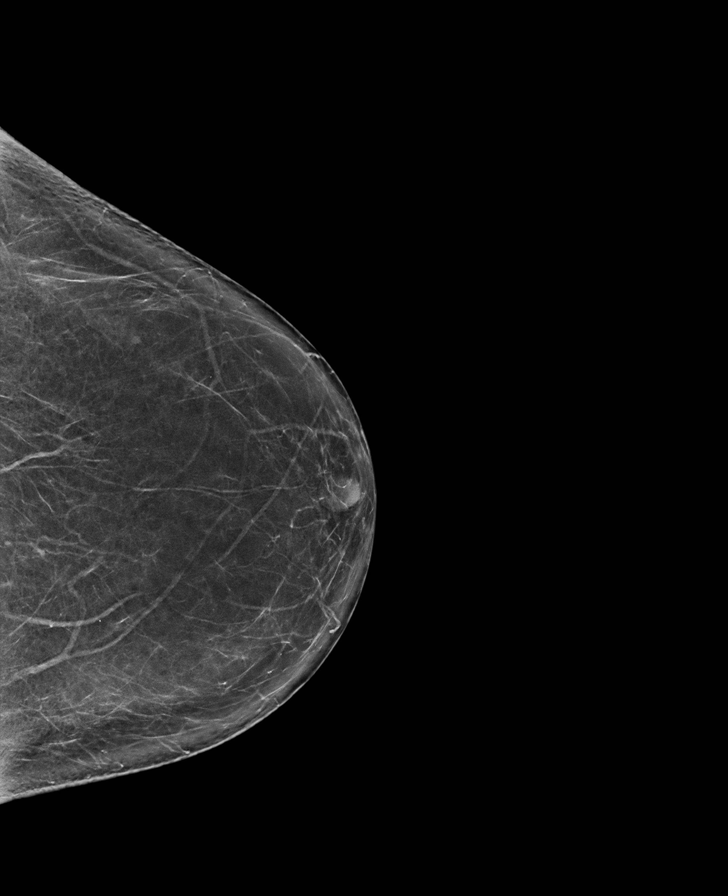

[R CC synth-2D]
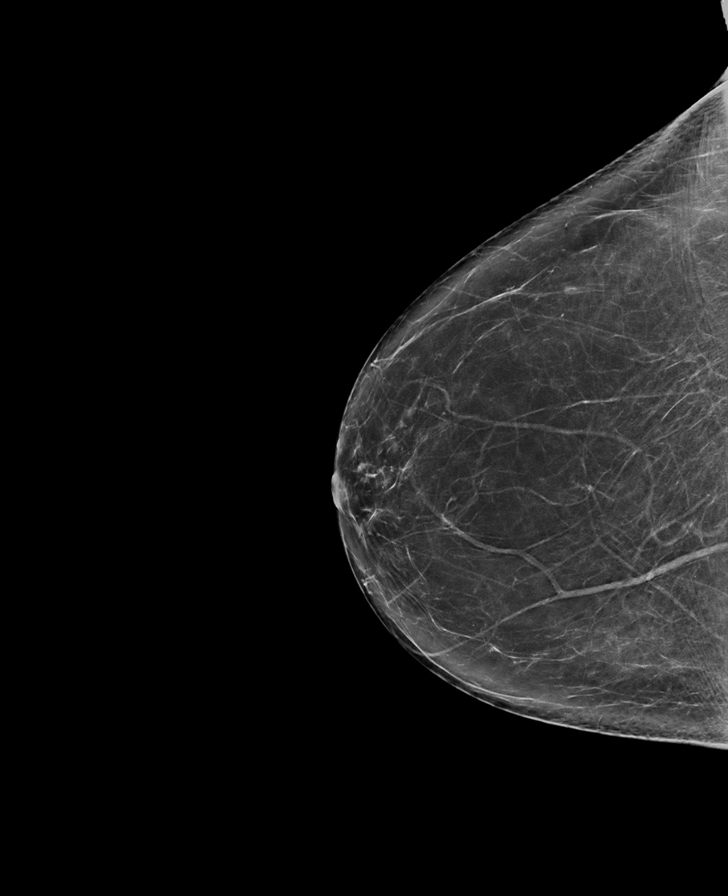

[R MLO synth-2D]
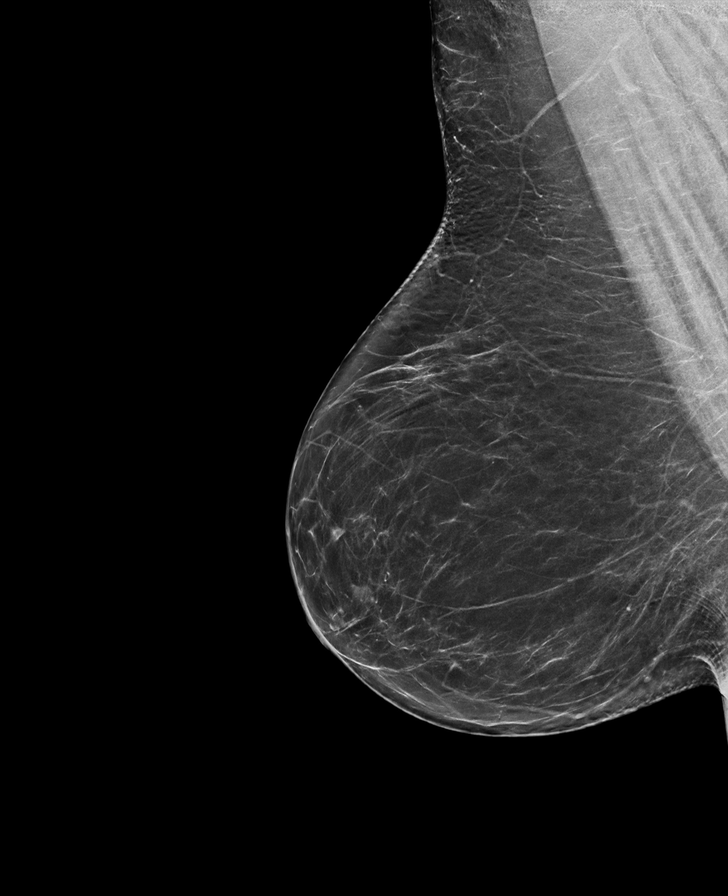

[L MLO tomo · tomo slice 39/77.0]
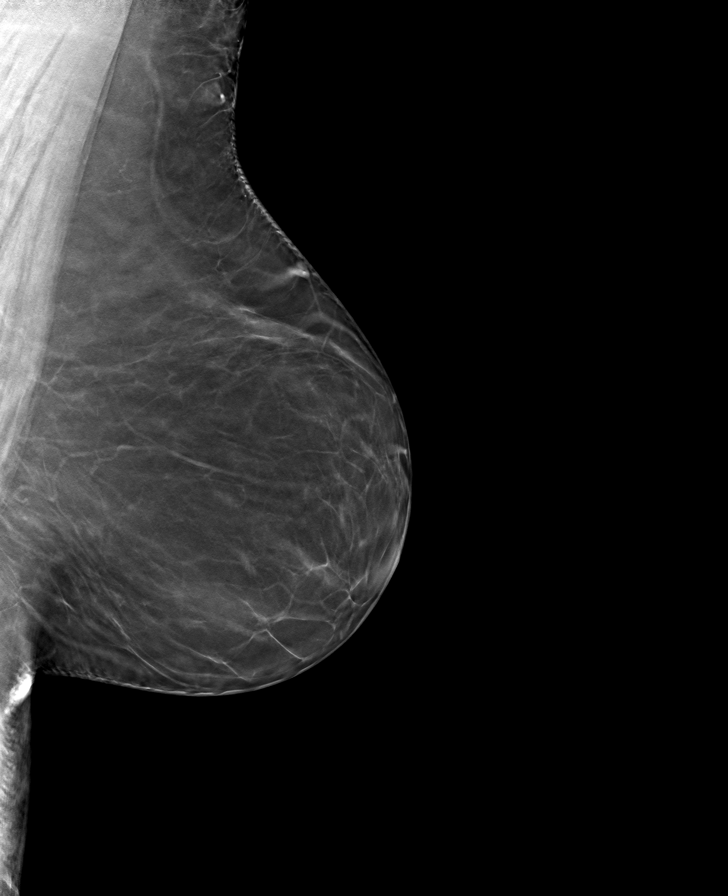

[R CC tomo · tomo slice 39/76.0]
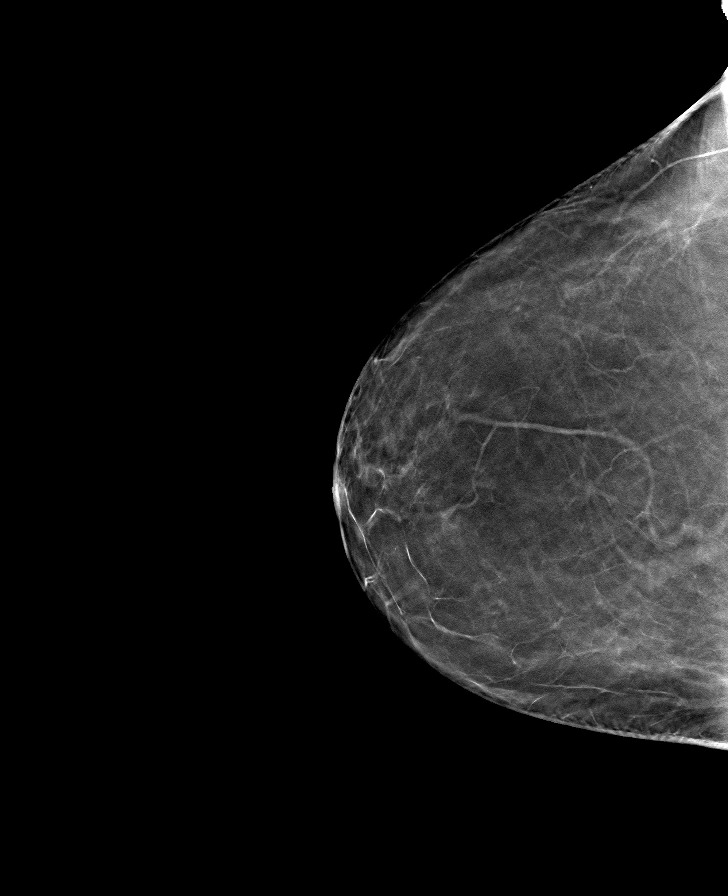

[R MLO tomo · tomo slice 38/75.0]
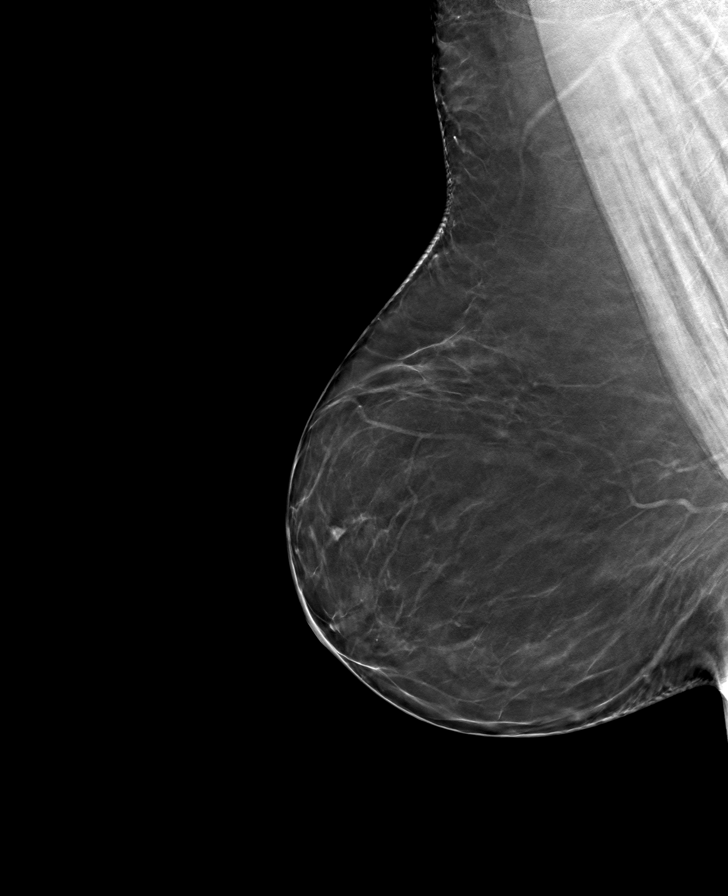

[L CC tomo · tomo slice 37/74.0]
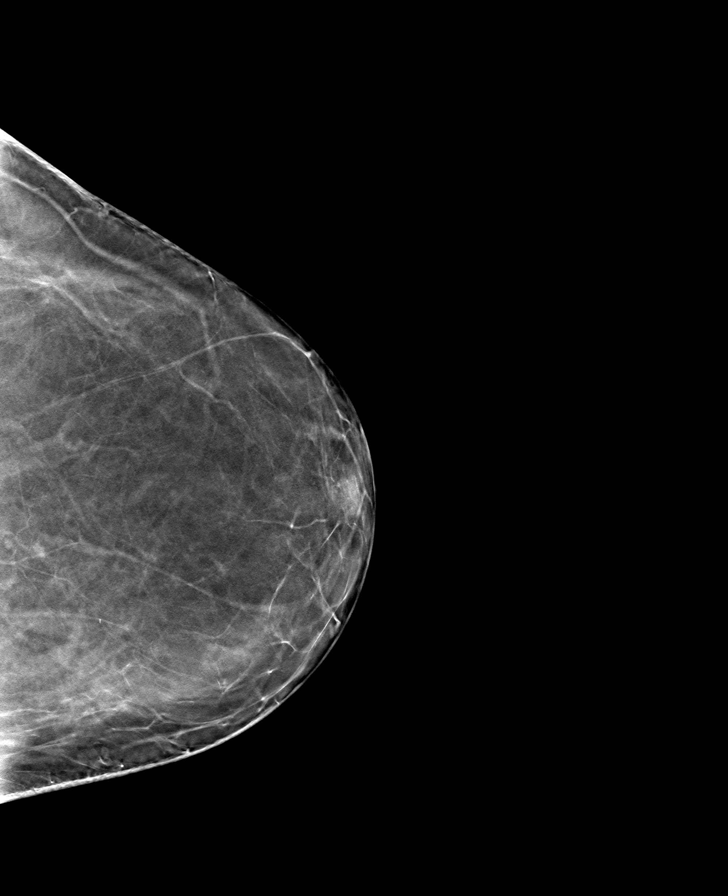

[8 of 24 positions shown; findings below may reference images not displayed]

FINDINGS: There are no findings suspicious for malignancy.
IMPRESSION: No mammographic evidence of malignancy. A result letter of this
screening mammogram will be mailed directly to the patient.

RECOMMENDATION:
Screening mammogram in one year. (Code:0E-3-N98)

BI-RADS CATEGORY  1: Negative.

## 2023-11-05 ENCOUNTER — Other Ambulatory Visit: Payer: Self-pay | Admitting: Internal Medicine

## 2023-11-05 DIAGNOSIS — F17210 Nicotine dependence, cigarettes, uncomplicated: Secondary | ICD-10-CM

## 2023-11-05 DIAGNOSIS — I10 Essential (primary) hypertension: Secondary | ICD-10-CM
